# Patient Record
Sex: Female | Born: 1945 | Hispanic: No | Smoking: Never smoker
Health system: Southern US, Community
[De-identification: ages and names within clinical notes are randomized; demographics above are authoritative.]

## PROBLEM LIST (undated history)

## (undated) DIAGNOSIS — I517 Cardiomegaly: Secondary | ICD-10-CM

## (undated) DIAGNOSIS — I10 Essential (primary) hypertension: Secondary | ICD-10-CM

## (undated) DIAGNOSIS — Q254 Congenital malformation of aorta unspecified: Secondary | ICD-10-CM

## (undated) HISTORY — DX: Cardiomegaly: I51.7

## (undated) HISTORY — DX: Congenital malformation of aorta unspecified: Q25.40

## (undated) HISTORY — PX: HAND SURGERY: SHX662

## (undated) HISTORY — PX: WISDOM TOOTH EXTRACTION: SHX21

---

## 2004-01-14 ENCOUNTER — Other Ambulatory Visit: Admission: RE | Admit: 2004-01-14 | Discharge: 2004-01-14 | Payer: Self-pay | Admitting: Family Medicine

## 2004-02-11 ENCOUNTER — Encounter: Admission: RE | Admit: 2004-02-11 | Discharge: 2004-02-11 | Payer: Self-pay | Admitting: Family Medicine

## 2010-05-21 ENCOUNTER — Emergency Department (HOSPITAL_COMMUNITY)
Admission: EM | Admit: 2010-05-21 | Discharge: 2010-05-21 | Disposition: A | Discharge status: Home / Self Care | Payer: Self-pay | Attending: Emergency Medicine | Admitting: Emergency Medicine

## 2010-05-21 DIAGNOSIS — R059 Cough, unspecified: Secondary | ICD-10-CM | POA: Insufficient documentation

## 2010-05-21 DIAGNOSIS — I10 Essential (primary) hypertension: Secondary | ICD-10-CM | POA: Insufficient documentation

## 2010-05-21 DIAGNOSIS — R05 Cough: Secondary | ICD-10-CM | POA: Insufficient documentation

## 2010-05-21 LAB — POCT I-STAT, CHEM 8
BUN: 11 mg/dL (ref 6–23)
Calcium, Ion: 1.28 mmol/L (ref 1.12–1.32)
Chloride: 105 mEq/L (ref 96–112)
Creatinine, Ser: 0.9 mg/dL (ref 0.4–1.2)
Glucose, Bld: 84 mg/dL (ref 70–99)
HCT: 45 % (ref 36.0–46.0)
Hemoglobin: 15.3 g/dL — ABNORMAL HIGH (ref 12.0–15.0)
Potassium: 3.7 mEq/L (ref 3.5–5.1)
Sodium: 142 mEq/L (ref 135–145)
TCO2: 28 mmol/L (ref 0–100)

## 2010-06-20 ENCOUNTER — Emergency Department (HOSPITAL_COMMUNITY)
Admission: EM | Admit: 2010-06-20 | Discharge: 2010-06-20 | Disposition: A | Discharge status: Home / Self Care | Payer: Self-pay | Attending: Emergency Medicine | Admitting: Emergency Medicine

## 2010-06-20 DIAGNOSIS — I10 Essential (primary) hypertension: Secondary | ICD-10-CM | POA: Insufficient documentation

## 2010-06-20 LAB — POCT I-STAT, CHEM 8
BUN: 14 mg/dL (ref 6–23)
Calcium, Ion: 1.31 mmol/L (ref 1.12–1.32)
Chloride: 103 mEq/L (ref 96–112)
Creatinine, Ser: 0.9 mg/dL (ref 0.4–1.2)
Glucose, Bld: 78 mg/dL (ref 70–99)
HCT: 43 % (ref 36.0–46.0)
Hemoglobin: 14.6 g/dL (ref 12.0–15.0)
Potassium: 3.4 mEq/L — ABNORMAL LOW (ref 3.5–5.1)
Sodium: 141 mEq/L (ref 135–145)
TCO2: 30 mmol/L (ref 0–100)

## 2010-07-22 ENCOUNTER — Other Ambulatory Visit: Payer: Self-pay | Admitting: Family Medicine

## 2010-07-22 DIAGNOSIS — N644 Mastodynia: Secondary | ICD-10-CM

## 2010-07-27 ENCOUNTER — Ambulatory Visit
Admission: RE | Admit: 2010-07-27 | Discharge: 2010-07-27 | Disposition: A | Discharge status: Home / Self Care | Payer: Self-pay | Source: Ambulatory Visit | Attending: Family Medicine | Admitting: Family Medicine

## 2010-07-27 DIAGNOSIS — N644 Mastodynia: Secondary | ICD-10-CM

## 2013-10-29 ENCOUNTER — Emergency Department (HOSPITAL_COMMUNITY)
Admission: EM | Admit: 2013-10-29 | Discharge: 2013-10-29 | Disposition: A | Discharge status: Home / Self Care | Payer: Medicare Other | Attending: Emergency Medicine | Admitting: Emergency Medicine

## 2013-10-29 ENCOUNTER — Encounter (HOSPITAL_COMMUNITY): Payer: Self-pay | Admitting: Emergency Medicine

## 2013-10-29 DIAGNOSIS — W540XXA Bitten by dog, initial encounter: Secondary | ICD-10-CM | POA: Diagnosis not present

## 2013-10-29 DIAGNOSIS — I10 Essential (primary) hypertension: Secondary | ICD-10-CM | POA: Insufficient documentation

## 2013-10-29 DIAGNOSIS — Y93K1 Activity, walking an animal: Secondary | ICD-10-CM | POA: Diagnosis not present

## 2013-10-29 DIAGNOSIS — Y9289 Other specified places as the place of occurrence of the external cause: Secondary | ICD-10-CM | POA: Diagnosis not present

## 2013-10-29 DIAGNOSIS — Z23 Encounter for immunization: Secondary | ICD-10-CM | POA: Insufficient documentation

## 2013-10-29 DIAGNOSIS — S31109A Unspecified open wound of abdominal wall, unspecified quadrant without penetration into peritoneal cavity, initial encounter: Secondary | ICD-10-CM | POA: Diagnosis not present

## 2013-10-29 DIAGNOSIS — I159 Secondary hypertension, unspecified: Secondary | ICD-10-CM

## 2013-10-29 HISTORY — DX: Essential (primary) hypertension: I10

## 2013-10-29 MED ORDER — LISINOPRIL 10 MG PO TABS
10.0000 mg | ORAL_TABLET | Freq: Once | ORAL | Status: AC
  Administered 2013-10-29: 10 mg via ORAL
  Filled 2013-10-29: qty 1

## 2013-10-29 MED ORDER — LISINOPRIL 10 MG PO TABS: 10.0000 mg | ORAL_TABLET | Freq: Once | ORAL | Status: DC

## 2013-10-29 MED ORDER — TETANUS-DIPHTH-ACELL PERTUSSIS 5-2.5-18.5 LF-MCG/0.5 IM SUSP
0.5000 mL | Freq: Once | INTRAMUSCULAR | Status: AC
  Administered 2013-10-29: 0.5 mL via INTRAMUSCULAR
  Filled 2013-10-29: qty 0.5

## 2013-10-29 MED ORDER — AMOXICILLIN-POT CLAVULANATE 500-125 MG PO TABS: 1.0000 | ORAL_TABLET | Freq: Three times a day (TID) | ORAL | Status: DC

## 2013-10-29 NOTE — Discharge Instructions (Signed)
Animal Bite Animal bite wounds can get infected. It is important to get proper medical treatment. Ask your doctor if you need a rabies shot. HOME CARE   Follow your doctor's instructions for taking care of your wound.  Only take medicine as told by your doctor.  Take your medicine (antibiotics) as told. Finish them even if you start to feel better.  Keep all doctor visits as told. You may need a tetanus shot if:   You cannot remember when you had your last tetanus shot.  You have never had a tetanus shot.  The injury broke your skin. If you need a tetanus shot and you choose not to have one, you may get tetanus. Sickness from tetanus can be serious. GET HELP RIGHT AWAY IF:   Your wound is warm, red, sore, or puffy (swollen).  You notice yellowish-white fluid (pus) or a bad smell coming from the wound.  You see a red line on the skin coming from the wound.  You have a fever, chills, or you feel sick.  You feel sick to your stomach (nauseous), or you throw up (vomit).  Your pain does not go away, or it gets worse.  You have trouble moving the injured part.  You have questions or concerns. MAKE SURE YOU:   Understand these instructions.  Will watch your condition.  Will get help right away if you are not doing well or get worse. Document Released: 03/27/2005 Document Revised: 06/19/2011 Document Reviewed: 11/16/2010 [facility] Patient Information 2015 [place], [place]. This information is not intended to replace advice given to you by your health care provider. Make sure you discuss any questions you have with your health care provider.    Emergency Department Resource Guide 1) Find a Doctor and Pay Out of Pocket Although you won't have to find out who is covered by your insurance plan, it is a good idea to ask around and get recommendations. You will then need to call the office and see if the doctor you have chosen will accept you as a new patient and what types of  options they offer for patients who are self-pay. Some doctors offer discounts or will set up payment plans for their patients who do not have insurance, but you will need to ask so you aren't surprised when you get to your appointment.  2) Contact Your Local Health Department Not all health departments have doctors that can see patients for sick visits, but many do, so it is worth a call to see if yours does. If you don't know where your local health department is, you can check in your phone book. The CDC also has a tool to help you locate your state's health department, and many state websites also have listings of all of their local health departments.  3) Find a [facility] If your illness is not likely to be very severe or complicated, you may want to try a walk in clinic. These are popping up all over the country in pharmacies, drugstores, and shopping centers. They're usually staffed by nurse practitioners or physician assistants that have been trained to treat common illnesses and complaints. They're usually fairly quick and inexpensive. However, if you have serious medical issues or chronic medical problems, these are probably not your best option.  No Primary Care Doctor: - Call Health Connect at  [phone] - they can help you locate a primary care doctor that  accepts your insurance, provides certain services, etc. - Physician Referral Service- [phone]  Chronic Pain Problems: Organization         Address  Phone   Notes  [facility]  [phone] Patients need to be referred by their primary care doctor.   Medication Assistance: Organization         Address  Phone   Notes  [facility] Medication [facility] [place] [place], [place] [address] [phone] --Must be a resident of [facility] -- Must have NO insurance coverage whatsoever (no Medicaid/ Medicare, etc.) -- The pt. MUST have a primary care doctor that directs  their care regularly and follows them in the community   Med[facility]ist  [phone]   [employer]  [phone]    Agencies that provide inexpensive medical care: Organization         Address  Phone   Notes  [place]  [phone]   [facility]    [phone]   [facility], [place] [address] [phone]   [place] [address], [place] [phone]   Planned Parenthood    [phone]   [facility]    [phone]   [place] and [place] [address], [place] Phone:  [phone], Fax:  [phone] Hours of Operation:  9 am - 6 pm, M-F.  Also accepts Medicaid/Medicare and self-pay.  [facility] for [address], [address], [place] Phone: [phone], Fax: [phone]. Hours of Operation:  8:30 am - 5:30 pm, M-F.  Also accepts Medicaid and self-pay.  [facility] [place] [address], [place] Phone: [phone]   [place], [place], [place] [phone], Ext. [phone] Mondays & Thursdays: 7-9 AM.  First 15 patients are seen on a first come, first serve basis.    [place] Providers:  Organization         Address  Phone   Notes  [facility] [address], [place] [phone] Also accepts self-pay patients.  [facility] [address], [place]  [phone]   [place], [address], [place] [phone]   [facility] [address], [place] [phone]   Lucianne Lei [address], [place]   [phone] Only accepts [place] Access [place] patients after they have their name applied to their card.   Self-Pay (no insurance) in [facility]:  Organization          Address  Phone   Notes  Sickle Cell Patients, [facility] [place] [phone]   [facility] [place] [phone]   [facility] [place]  [place], [place], [place] [phone]   [facility]/Dr. Osei-Bonsu  [address] [place] or [address], [place] [phone] Phone number for both [place] and [place] locations is the same.  [facility] [address], [place] [phone]   [facility] [address], [place] or [address] [phone]   [facility] [address], [place] [phone], phone; [phone], fax Sees  patients 1st and 3rd Saturday of every month.  Must not qualify for public or private insurance (i.e. Medicaid, Medicare, [place] Health Choice, Veterans' Benefits)  Household income should be no more than 200% of the poverty level The clinic cannot treat you if you are pregnant or think you are pregnant  Sexually transmitted diseases are not treated at the clinic.    Dental Care: Organization         Address  Phone  Notes  [facility] [place] [phone] Accepts children up to age 63 who are enrolled in [place] or [place]; pregnant women with a Medicaid card; and children who have applied for Medicaid or [place] Health Choice, but were declined, whose parents can pay a reduced fee at time of service.  [facility] Department of [facility]  [address], [place] [phone] Accepts children up to age 75 who are enrolled in [place] or [place]; pregnant women with a Medicaid card; and children who have applied for Medicaid or  Health Choice, but were declined, whose parents can pay a reduced fee at time of  service.  [facility] Adult [facility] Access PROGRAM  [place] [phone] Patients are seen by appointment only. Walk-ins are not accepted. [place] will see patients 67 years of age and older. Monday - Tuesday (8am-5pm) Most Wednesdays (8:30-5pm) $30 per visit, cash only  [facility] Adult [facility] Access PRO[facility]  [address], [facility] [phone] Patients are seen by appointment only. Walk-ins are not accepted. [place] will see patients 65 years of age and older. One Wednesday Evening (Monthly: Volunteer Based).  $30 per visit, cash only  [place]  [phone] for adults; Children under age 49, call Graduate Pediatric Dentistry at [phone]. Children aged 1-14, please call [phone] to request a pediatric application.  Dental services are provided in all areas of dental care including fillings, crowns and bridges, complete and partial dentures, implants, gum treatment, root canals, and extractions. Preventive care is also provided. Treatment is provided to both adults and children. Patients are selected via a lottery and there is often a waiting list.   [facility] [address] [place]  [phone] [email or web address]   [facility] [address] [place], [place] [phone], Ext. [phone] Second and Fourth Thursday of each month, opens at 6:30 AM; Clinic ends at 9 AM.  Patients are seen on a first-come first-served basis, and a limited number are seen during each clinic.   [facility]  [address] Hillard Danker [place], [place] [phone]   Eligibility Requirements You must have lived in [place], [place], or [place] counties for at least the last three months.   You cannot be eligible for state or federal sponsored [employer], including [employer], [place], or [employer].   You generally cannot be eligible for healthcare insurance through your employer.     How to apply: Eligibility screenings are held every Tuesday and Wednesday afternoon from 1:00 pm until 4:00 pm. You do not need an appointment for the interview!  [facility] [address] [place], [place]   [place]  [place] Department  [place]  [phone]    Behavioral Health Resources in the Community: Intensive Outpatient Programs  Organization         Address  Phone  Notes  [employer] [facility] [address] [place], [place] [phone]   [facility] [address], [place], [place]   ADS: [facility] Drug [facility] [address], [place], [place]   [place] [address],  [place], [place] or [phone]   Substance Abuse Resources Organization         Address  Phone  Notes  Alcohol and Drug Services  [phone]   [place]  [phone]   [place]   Chinita Pester  [phone]   Residential & Outpatient Substance Abuse Program  [phone]   Psychological Services Organization         Address  Phone  Notes  [facility]  [place]  [phone]   [place] [address] [place] or [phone]    Mobile Crisis Teams Organization         Address  Phone  Notes  [facility], Mobile Crisis Care Unit  [phone]   [facility]  [address] [place], [place]   [place] [address] [place] [phone]    Self-Help/Support Groups Organization         Address  Phone             Notes  [place]. of [place] - variety of support groups  [place] Call for more information  Narcotics Anonymous (NA), [facility] [address]  Dr, [employer] [place]  2 meetings at this location   Special educational needs teacher         Address  Phone  Notes  [facility] Residential Treatment [place],    [place]  [phone]   [facility]  [address] [place] [phone], [place], [place]   [employer] [place], [place] [phone] Admissions: 8am-3pm M-F  Incentives Substance [place] [address]    [place], [place] [phone]   [facility] [address] [place], [place], [place]   [facility] [address]  [place], [place]   [facility] Programs - Intensive Outpatient [address], [place], [place]   [facility] [address]  [place], [place] [phone] or [phone]   [facility] Services (RTS) [address] [place], [place] Accepts Medicaid  [facility] [address],  [place] [phone] Substance Abuse/Addiction Treatment   [facility] Organization         Address  Phone  Notes  [facility]  [phone]   Domenic Schwab, PhD [address] [place], [place]   [phone] or [phone]   [place], [place] [phone]   [place] [address] [place], [place] [phone] Insurance/Medicaid/sponsorship through [facility] and Families [address] [place]                                    [place], [place] [phone] [place] [address] [place] [phone]    Dr. Adele Schilder  (  [phone]) [phone]   [facility] Dept. 1) [address] [place] 2) [place] 3)  [place] [address], [place] [phone]  [phone]   [place] [phone] or [phone] (After Hours)

## 2013-10-29 NOTE — ED Provider Notes (Signed)
CSN: [id number]     Arrival date & time 10/29/13  1209 History   First MD Initiated Contact with Patient 10/29/13 1235     Chief Complaint  Patient presents with  . Animal Bite     (Consider location/radiation/quality/duration/timing/severity/associated sxs/prior Treatment) The history is provided by the patient and a relative. A language interpreter was used.   Pt is a 68yo female with hx of HTN and medication non-compliance, presenting to ED with c/o right flank pain after she was attacked by her neighbor's pit-bull yesterday around 6PM while walking her dog. Pt is accompanied by her daughter. Neighbor advised pt the dog has its rabies vaccinations.  Pt states pain is constant, sore, 4/10. No pain medication taken PTA.    Denies any other injuries. Pt states she is here to make sure it is not infected as bruising has worsened. Denies fever, n/v/d.  Last Tdap: unknown.  Pt states she has not taken her BP medication for about 3 years.  Daughter states pt won't take her medication because she is stubborn.    Past Medical History  Diagnosis Date  . Hypertension    Past Surgical History  Procedure Laterality Date  . Hand surgery     History reviewed. No pertinent family history. History  Substance Use Topics  . Smoking status: Never Smoker   . Smokeless tobacco: Not on file  . Alcohol Use: No   OB History   Grav Para Term Preterm Abortions TAB SAB Ect Mult Living                 Review of Systems  Constitutional: Negative for fever and chills.  Respiratory: Negative for cough and shortness of breath.   Musculoskeletal: Positive for myalgias. Negative for back pain.  Skin: Positive for color change and wound.  All other systems reviewed and are negative.     Allergies  Review of patient's allergies indicates no known allergies.  Home Medications   Prior to Admission medications   Medication Sig Start Date End Date Taking? Authorizing Provider  amoxicillin-clavulanate  (AUGMENTIN) 500-125 MG per tablet Take 1 tablet (500 mg total) by mouth every 8 (eight) hours. 10/29/13   Noland Fordyce, PA-C  lisinopril (PRINIVIL,ZESTRIL) 10 MG tablet Take 1 tablet (10 mg total) by mouth once. 10/29/13   Noland Fordyce, PA-C   BP 156/97  Pulse 57  Temp(Src) 98.2 F (36.8 C) (Oral)  Resp 18  SpO2 98% Physical Exam  Nursing note and vitals reviewed. Constitutional: She appears well-developed and well-nourished. No distress.  Pt lying on exam bed, NAD.   HENT:  Head: Normocephalic and atraumatic.  Eyes: Conjunctivae are normal. No scleral icterus.  Neck: Normal range of motion.  Cardiovascular: Normal rate, regular rhythm and normal heart sounds.   Pulmonary/Chest: Effort normal and breath sounds normal. No respiratory distress. She has no wheezes. She has no rales. She exhibits no tenderness.  Abdominal: Soft. Bowel sounds are normal. She exhibits no distension and no mass. There is tenderness ( mild tenderness to right flank over wounds and ecchymosis). There is no rebound and no guarding.    Two areas on right flank of ecchymosis and superficial lacerations with dried blood. No active bleeding.  No foreign bodies. No induration or fluctuance. Mild tenderness to palpation.  Abdomen otherwise soft, non-tender. Non-distended.   Musculoskeletal: Normal range of motion.  Neurological: She is alert.  Skin: Skin is warm and dry. She is not diaphoretic.    ED Course  Procedures  The wound is cleansed, debrided of foreign material as much as possible, and dressed. The patient is alerted to watch for any signs of infection (redness, pus, pain, increased swelling or fever) and call if such occurs. Home wound care instructions are provided. Tetanus vaccination status reviewed: unknown, pt given Tdap in ED.  Labs Review Labs Reviewed - No data to display  Imaging Review No results found.   EKG Interpretation None      MDM   Final diagnoses:  Dog bite  Secondary  hypertension, unspecified    Pt is a 68yo female presenting to ED with superficial lacerations and ecchymosis to right flank after attacked by neighbor's dog yesterday.  No active bleeding.  No gaping wounds. No bony tenderness.  Wound does not appear infected. Will irrigate wounds and bandage with gauze. Tdap given in ED.  Will start pt on Augmentin.  Pt encouraged to take BP medication, will write prescription today for lisinopril. Discussed risks of uncontrolled BP. Advised to f/u with PCP for recheck and ongoing management of BP. Return precautions provided. Pt verbalized understanding and agreement with tx plan.     Noland Fordyce, PA-C 10/29/13 1504

## 2013-10-29 NOTE — ED Notes (Addendum)
Pt was walking her dog yesterday and a neighbors dog attacked her. She has several puncture wounds with dried blood to R side/abd area with bruising. She said the dogs owner told her that is does have its rabies vaccinations. Her bp is elevated she states she has a hx htn but does not take any medication for it

## 2013-10-31 NOTE — ED Provider Notes (Signed)
Medical screening examination/treatment/procedure(s) were performed by non-physician practitioner and as supervising physician I was immediately available for consultation/collaboration.   EKG Interpretation None       Jasper Riling. Alvino Chapel, MD 10/31/13 [phone]

## 2014-11-09 ENCOUNTER — Ambulatory Visit (INDEPENDENT_AMBULATORY_CARE_PROVIDER_SITE_OTHER): Payer: Medicare Other | Admitting: Emergency Medicine

## 2014-11-09 VITALS — BP 172/104 | HR 68 | Temp 98.6°F | Resp 17 | Ht 59.5 in | Wt 132.0 lb

## 2014-11-09 DIAGNOSIS — I10 Essential (primary) hypertension: Secondary | ICD-10-CM | POA: Diagnosis not present

## 2014-11-09 LAB — CBC
HCT: 40.9 % (ref 36.0–46.0)
Hemoglobin: 12.7 g/dL (ref 12.0–15.0)
MCH: 22.2 pg — ABNORMAL LOW (ref 26.0–34.0)
MCHC: 31.1 g/dL (ref 30.0–36.0)
MCV: 71.4 fL — ABNORMAL LOW (ref 78.0–100.0)
MPV: 10 fL (ref 8.6–12.4)
Platelets: 266 10*3/uL (ref 150–400)
RBC: 5.73 MIL/uL — ABNORMAL HIGH (ref 3.87–5.11)
RDW: 15.9 % — ABNORMAL HIGH (ref 11.5–15.5)
WBC: 5.7 10*3/uL (ref 4.0–10.5)

## 2014-11-09 LAB — COMPREHENSIVE METABOLIC PANEL
ALT: 10 U/L (ref 6–29)
AST: 22 U/L (ref 10–35)
Albumin: 3.9 g/dL (ref 3.6–5.1)
Alkaline Phosphatase: 61 U/L (ref 33–130)
BUN: 18 mg/dL (ref 7–25)
CO2: 24 mmol/L (ref 20–31)
Calcium: 9.4 mg/dL (ref 8.6–10.4)
Chloride: 105 mmol/L (ref 98–110)
Creat: 0.88 mg/dL (ref 0.50–0.99)
Glucose, Bld: 88 mg/dL (ref 65–99)
Potassium: 4 mmol/L (ref 3.5–5.3)
Sodium: 140 mmol/L (ref 135–146)
Total Bilirubin: 0.4 mg/dL (ref 0.2–1.2)
Total Protein: 6.6 g/dL (ref 6.1–8.1)

## 2014-11-09 LAB — LIPID PANEL
Cholesterol: 165 mg/dL (ref 125–200)
HDL: 40 mg/dL — ABNORMAL LOW (ref >=46)
LDL Cholesterol: 99 mg/dL (ref <130)
Total CHOL/HDL Ratio: 4.1 Ratio (ref <=5.0)
Triglycerides: 129 mg/dL (ref <150)
VLDL: 26 mg/dL (ref <30)

## 2014-11-09 MED ORDER — LISINOPRIL-HYDROCHLOROTHIAZIDE 20-12.5 MG PO TABS: 1.0000 | ORAL_TABLET | Freq: Every day | ORAL | Status: DC

## 2014-11-09 NOTE — Patient Instructions (Signed)

## 2014-11-09 NOTE — Progress Notes (Signed)
Subjective:  Patient ID: Mackenzie Taylor, female    DOB: 1946/01/15  Age: 69 y.o. MRN: [id number]  CC: Hypertension   HPI Mackenzie Taylor presents  with a history of hypertension. She has not been taking any blood pressure medicine at least for the last year. She has no symptoms but decided she better get her pressure check. She has no nausea vomiting chest pain headache shortness of breath cough peripheral edema or other complaints. She is not fasting and hasn't had lab done in a long time  History [place] has a past medical history of Hypertension.   She has past surgical history that includes Hand surgery.   Her  family history is not on file.  She   reports that she has never smoked. She does not have any smokeless tobacco history on file. She reports that she does not drink alcohol or use illicit drugs.  Outpatient Prescriptions Prior to Visit  Medication Sig Dispense Refill  . lisinopril (PRINIVIL,ZESTRIL) 10 MG tablet Take 1 tablet (10 mg total) by mouth once. 30 tablet 1  . amoxicillin-clavulanate (AUGMENTIN) 500-125 MG per tablet Take 1 tablet (500 mg total) by mouth every 8 (eight) hours. 21 tablet 0   No facility-administered medications prior to visit.    History   Social History  . Marital Status: Divorced    Spouse Name: N/A  . Number of Children: N/A  . Years of Education: N/A   Social History Main Topics  . Smoking status: Never Smoker   . Smokeless tobacco: Not on file  . Alcohol Use: No  . Drug Use: No  . Sexual Activity: Not on file   Other Topics Concern  . None   Social History Narrative     Review of Systems  Constitutional: Negative for fever, chills and appetite change.  HENT: Negative for congestion, ear pain, postnasal drip, sinus pressure and sore throat.   Eyes: Negative for pain and redness.  Respiratory: Negative for cough, shortness of breath and wheezing.   Cardiovascular: Negative for leg swelling.  Gastrointestinal: Negative for nausea,  vomiting, abdominal pain, diarrhea, constipation and blood in stool.  Endocrine: Negative for polyuria.  Genitourinary: Negative for dysuria, urgency, frequency and flank pain.  Musculoskeletal: Negative for gait problem.  Skin: Negative for rash.  Neurological: Negative for weakness and headaches.  Psychiatric/Behavioral: Negative for confusion and decreased concentration. The patient is not nervous/anxious.     Objective:  BP 172/104 mmHg  Pulse 68  Temp(Src) 98.6 F (37 C) (Oral)  Resp 17  Ht 4' 11.5" (1.511 m)  Wt 132 lb (59.875 kg)  BMI 26.23 kg/m2  SpO2 98%  Physical Exam  Constitutional: She is oriented to person, place, and time. She appears well-developed and well-nourished. No distress.  HENT:  Head: Normocephalic and atraumatic.  Right Ear: External ear normal.  Left Ear: External ear normal.  Nose: Nose normal.  Eyes: Conjunctivae and EOM are normal. Pupils are equal, round, and reactive to light. No scleral icterus.  Neck: Normal range of motion. Neck supple. No tracheal deviation present.  Cardiovascular: Normal rate, regular rhythm and normal heart sounds.   Pulmonary/Chest: Effort normal. No respiratory distress. She has no wheezes. She has no rales.  Abdominal: She exhibits no mass. There is no tenderness. There is no rebound and no guarding.  Musculoskeletal: She exhibits no edema.  Lymphadenopathy:    She has no cervical adenopathy.  Neurological: She is alert and oriented to person, place, and time. Coordination normal.  Skin: Skin is warm and dry. No rash noted.  Psychiatric: She has a normal mood and affect. Her behavior is normal.      Assessment & Plan:   Yomaira was seen today for hypertension.  Diagnoses and all orders for this visit:  Essential hypertension, benign Orders: -     Comprehensive metabolic panel -     Lipid panel -     CBC  Other orders -     lisinopril-hydrochlorothiazide (ZESTORETIC) 20-12.5 MG per tablet; Take 1 tablet by  mouth daily.   I have discontinued Ms. Hallett's amoxicillin-clavulanate, lisinopril, and amoxicillin. I am also having her start on lisinopril-hydrochlorothiazide.  Meds ordered this encounter  Medications  . DISCONTD: amoxicillin (AMOXIL) 500 MG capsule    Sig: Take 500 mg by mouth 3 (three) times daily.  [place] Kitchen lisinopril-hydrochlorothiazide (ZESTORETIC) 20-12.5 MG per tablet    Sig: Take 1 tablet by mouth daily.    Dispense:  90 tablet    Refill:  3    Appropriate red flag conditions were discussed with the patient as well as actions that should be taken.  Patient expressed his understanding.  Follow-up: Return in about 1 month (around 12/10/2014).  Roselee Culver, MD

## 2014-11-10 ENCOUNTER — Encounter: Payer: Self-pay | Admitting: Family Medicine

## 2014-12-07 ENCOUNTER — Ambulatory Visit: Payer: Medicare Other | Admitting: Family Medicine

## 2015-12-22 ENCOUNTER — Other Ambulatory Visit: Payer: Self-pay

## 2015-12-22 MED ORDER — LISINOPRIL-HYDROCHLOROTHIAZIDE 20-12.5 MG PO TABS: 1.0000 | ORAL_TABLET | Freq: Every day | ORAL | 0 refills | Status: DC

## 2016-04-27 ENCOUNTER — Ambulatory Visit (HOSPITAL_COMMUNITY)
Admission: EM | Admit: 2016-04-27 | Discharge: 2016-04-27 | Disposition: A | Discharge status: Home / Self Care | Payer: Medicare Other | Attending: Emergency Medicine | Admitting: Emergency Medicine

## 2016-04-27 ENCOUNTER — Encounter (HOSPITAL_COMMUNITY): Payer: Self-pay | Admitting: Emergency Medicine

## 2016-04-27 DIAGNOSIS — I160 Hypertensive urgency: Secondary | ICD-10-CM

## 2016-04-27 DIAGNOSIS — R05 Cough: Secondary | ICD-10-CM

## 2016-04-27 DIAGNOSIS — I10 Essential (primary) hypertension: Secondary | ICD-10-CM | POA: Diagnosis not present

## 2016-04-27 DIAGNOSIS — R059 Cough, unspecified: Secondary | ICD-10-CM

## 2016-04-27 MED ORDER — VALSARTAN-HYDROCHLOROTHIAZIDE 160-25 MG PO TABS: 1.0000 | ORAL_TABLET | Freq: Every day | ORAL | 0 refills | Status: DC

## 2016-04-27 NOTE — ED Provider Notes (Signed)
CSN: [id number]     Arrival date & time 04/27/16  1319 History   First MD Initiated Contact with Patient 04/27/16 1432     Chief Complaint  Patient presents with  . Hypertension   (Consider location/radiation/quality/duration/timing/severity/associated sxs/prior Treatment) 71 year old [place] female accompanied by her interpreter daughter is here for refill of her antihypertensive medication. She has had a history of hypertension for well over 10 years and for the past couple years she has been treated at [facility]. Apparently they will not treat her pressure now. They are [facility]. She has been out of her BP medicine for about a month. Her blood pressure today is 235/123. She has no complaints.  Second concern is that of a cough for almost 10 years. She states that she has been on the same medication (ACE inhibitor) for approximately that period of time and no one has determine the etiology of the cough.      Past Medical History:  Diagnosis Date  . Hypertension    Past Surgical History:  Procedure Laterality Date  . HAND SURGERY     History reviewed. No pertinent family history. Social History  Substance Use Topics  . Smoking status: Never Smoker  . Smokeless tobacco: Never Used  . Alcohol use No   OB History    No data available     Review of Systems  Constitutional: Negative.   HENT: Negative.   Respiratory: Positive for cough. Negative for chest tightness and shortness of breath.   Cardiovascular: Negative for chest pain, palpitations and leg swelling.  Gastrointestinal: Negative.   Musculoskeletal: Negative.   Skin: Negative.   Neurological: Negative for dizziness, syncope, speech difficulty, light-headedness and headaches.  Hematological: Does not bruise/bleed easily.  All other systems reviewed and are negative.   Allergies  Patient has no known allergies.  Home Medications   Prior to Admission medications    Medication Sig Start Date End Date Taking? Authorizing Provider  valsartan-hydrochlorothiazide (DIOVAN-HCT) 160-25 MG tablet Take 1 tablet by mouth daily. 04/27/16   Janne Napoleon, NP   Meds Ordered and Administered this Visit  Medications - No data to display  BP (!) 235/123 (BP Location: Left Arm)   Pulse 76   Temp 98.4 F (36.9 C) (Oral)   Resp 20   SpO2 100%  No data found.   Physical Exam  Constitutional: She appears well-developed and well-nourished. No distress.  HENT:  Head: Normocephalic and atraumatic.  Eyes: EOM are normal.  Neck: Normal range of motion. Neck supple.  Cardiovascular: Normal rate, regular rhythm, normal heart sounds and intact distal pulses.   No murmur heard. Pulmonary/Chest: Effort normal and breath sounds normal. No respiratory distress. She has no wheezes. She has no rales.  Musculoskeletal: Normal range of motion. She exhibits no edema.  Lymphadenopathy:    She has no cervical adenopathy.  Neurological: She is alert. No cranial nerve deficit. Coordination normal.  Skin: Skin is warm and dry.  Psychiatric: She has a normal mood and affect.  Nursing note and vitals reviewed.   Urgent Care Course     Procedures (including critical care time)  Labs Review Labs Reviewed - No data to display  Imaging Review No results found.   Visual Acuity Review  Right Eye Distance:   Left Eye Distance:   Bilateral Distance:    Right Eye Near:   Left Eye Near:    Bilateral Near:  MDM   1. Hypertensive urgency   2. Essential hypertension   3. Cough    Medication for your blood pressure has been changed since the lisinopril may be producing the cough that she has had for 10 years. The medicine that she is prescribed a similar but has much less risk for cough. Take as directed. She needs to follow-up with primary care provider sent is possible. [facility]. She will need to have additional lab work and  other studies. There is a risk of taking medications prescribed by a health care provider without obtaining a complete history, labwork or proper physical exam, etc. This cannot be completed adequately at an urgent care. There can be multiple problems, some serious,  associated with medications and undetermined conditions of your health status. This action is performed as a last resort in order to supply you with medication. By receiving these prescriptions you are ackowleging and accepting these risks and will not hold the prescriber or any agent of [facility] as responsible for any adverse outcomes.  Meds ordered this encounter  Medications  . valsartan-hydrochlorothiazide (DIOVAN-HCT) 160-25 MG tablet    Sig: Take 1 tablet by mouth daily.    Dispense:  30 tablet    Refill:  0    Order Specific Question:   Supervising Provider    Answer:   Carmela Hurt         Janne Napoleon, NP 04/27/16 1455

## 2016-04-27 NOTE — Discharge Instructions (Signed)
Medication for your blood pressure has been changed since the lisinopril may be producing the cough that she has had for 10 years. The medicine that she is prescribed a similar but has much less risk for cough. Take as directed. She needs to follow-up with primary care provider sent is possible. [facility]. She will need to have additional lab work and other studies. There is a risk of taking medications prescribed by a health care provider without obtaining a complete history, labwork or proper physical exam, etc. This cannot be completed adequately at an urgent care. There can be multiple problems, some serious,  associated with medications and undetermined conditions of your health status. This action is performed as a last resort in order to supply you with medication. By receiving these prescriptions you are ackowleging and accepting these risks and will not hold the prescriber or any agent of [facility] as responsible for any adverse outcomes.

## 2016-04-27 NOTE — ED Triage Notes (Signed)
Via 71 y/o daughter, [place] interpreter...   Pt c/o HTN... Has not had BP meds x1 month  Denies: HAs, CP, n/v, diaphoresis, blurred vision  Does not have PCP  A&O x4... NAD

## 2016-04-27 NOTE — ED Notes (Signed)
Notified Janne Napoleon, NP of BP

## 2016-04-30 ENCOUNTER — Ambulatory Visit (HOSPITAL_COMMUNITY)
Admission: EM | Admit: 2016-04-30 | Discharge: 2016-04-30 | Disposition: A | Discharge status: Home / Self Care | Payer: Medicare Other | Attending: Emergency Medicine | Admitting: Emergency Medicine

## 2016-04-30 ENCOUNTER — Ambulatory Visit (INDEPENDENT_AMBULATORY_CARE_PROVIDER_SITE_OTHER): Payer: Medicare Other

## 2016-04-30 ENCOUNTER — Encounter (HOSPITAL_COMMUNITY): Payer: Self-pay | Admitting: *Deleted

## 2016-04-30 DIAGNOSIS — Z8679 Personal history of other diseases of the circulatory system: Secondary | ICD-10-CM | POA: Diagnosis not present

## 2016-04-30 DIAGNOSIS — M7989 Other specified soft tissue disorders: Secondary | ICD-10-CM | POA: Diagnosis not present

## 2016-04-30 DIAGNOSIS — S5011XA Contusion of right forearm, initial encounter: Secondary | ICD-10-CM

## 2016-04-30 DIAGNOSIS — S59911A Unspecified injury of right forearm, initial encounter: Secondary | ICD-10-CM | POA: Diagnosis not present

## 2016-04-30 DIAGNOSIS — W010XXA Fall on same level from slipping, tripping and stumbling without subsequent striking against object, initial encounter: Secondary | ICD-10-CM

## 2016-04-30 DIAGNOSIS — S50811A Abrasion of right forearm, initial encounter: Secondary | ICD-10-CM

## 2016-04-30 NOTE — ED Provider Notes (Signed)
CSN: [id number]     Arrival date & time 04/30/16  1705 History   First MD Initiated Contact with Patient 04/30/16 1856     Chief Complaint  Patient presents with  . Arm Injury   (Consider location/radiation/quality/duration/timing/severity/associated sxs/prior Treatment)  Pt is [place] speaking, adult daughter translated for pt.   HPI\ Mackenzie Taylor is a 71 y.o. female presenting to UC with c/o Right forearm pain, bruising and swelling that started about 2 hours ago after pt tripped and fell in her back yard. Pt concerned she may have something such as wood or a nail stuck in her skin due to the swelling but she notes it was so dark she is unsure what she fell on.  Pain is aching and sore, 4/10.  Denies hitting her head or other injuries during the fall.  BP elevated in triage. Pt was seen in UC 4 days ago and started on a new BP medication. Daughter states they are currently looking for a PCP.   Past Medical History:  Diagnosis Date  . Hypertension    Past Surgical History:  Procedure Laterality Date  . HAND SURGERY    . WISDOM TOOTH EXTRACTION     No family history on file. Social History  Substance Use Topics  . Smoking status: Never Smoker  . Smokeless tobacco: Never Used  . Alcohol use No   OB History    No data available     Review of Systems  Musculoskeletal: Positive for joint swelling and myalgias. Negative for arthralgias.       Right forearm   Skin: Positive for color change and wound. Negative for rash.    Allergies  Patient has no known allergies.  Home Medications   Prior to Admission medications   Medication Sig Start Date End Date Taking? Authorizing Provider  ASPIRIN 81 PO Take by mouth.   Yes Historical Provider, MD  valsartan-hydrochlorothiazide (DIOVAN-HCT) 160-25 MG tablet Take 1 tablet by mouth daily. 04/27/16  Yes Janne Napoleon, NP   Meds Ordered and Administered this Visit  Medications - No data to display  BP (!) 174/116   Pulse 73   Temp  97.5 F (36.4 C) (Oral)   Resp 16   SpO2 100%  No data found.   Physical Exam  Constitutional: She is oriented to person, place, and time. She appears well-developed and well-nourished.  HENT:  Head: Normocephalic and atraumatic.  Eyes: EOM are normal.  Neck: Normal range of motion.  Cardiovascular: Normal rate.   Pulmonary/Chest: Effort normal.  Musculoskeletal: Normal range of motion. She exhibits edema and tenderness.  Right forearm: mild edema to dorsal mid forearm. Tender. Full ROM elbow and wrist, non-tender. (see skin exam)  Neurological: She is alert and oriented to person, place, and time.  Skin: Skin is warm and dry.  Right forearm, dorsal mid aspect: 2cm area of edema and ecchymosis with hematoma. Centralized superficial abrasion. No active bleeding. Mildly tender. No foreign body seen or palpated.  Psychiatric: She has a normal mood and affect. Her behavior is normal.  Nursing note and vitals reviewed.   Urgent Care Course     Procedures (including critical care time)  Labs Review Labs Reviewed - No data to display  Imaging Review Dg Forearm Right  Result Date: 04/30/2016 CLINICAL DATA:  Pain after fall.  Posterolateral for bruising. EXAM: RIGHT FOREARM - 2 VIEW COMPARISON:  None. FINDINGS: There soft tissue swelling along the dorsum of the forearm. Proximal ulnar spurring is seen posteriorly  and along its ulnar aspect off the coronoid. Small accessory ossicle is noted off the tip of the ulnar styloid. No acute fracture nor bone destruction. No malalignment at the elbow joint nor joint effusion. The osseous elements of the carpal bones are nonacute in appearance. IMPRESSION: Soft tissue swelling without acute osseous abnormality of the right forearm. Proximal ulnar spurring. Electronically Signed   By: Ashley Royalty M.D.   On: 04/30/2016 19:53     MDM   1. Traumatic hematoma of right forearm, initial encounter   2. Abrasion of right forearm, initial encounter   3.  History of hypertension   4. Fall from slip, trip, or stumble, initial encounter    Hematoma and abrasion to Right forearm.   No foreign body noted on exam or on imaging. Home care instructions for hematoma and wound care provided. Also encouraged to keep taking BP medications as prescribed and f/u with PCP for ongoing maintenance of HTN.     Noland Fordyce, PA-C 04/30/16 2012

## 2016-04-30 NOTE — Discharge Instructions (Signed)
° °  Keep wound clean with warm water and soap.  You may use over the counter antibiotic ointment such as polysporin or neosporin then apply a bandage 2-3 times daily for 1 week.   You may apply a warm damp washcloth to swollen ara to help with soreness.  Alternate with a cool compress 2-3 times daily.

## 2016-04-30 NOTE — ED Triage Notes (Signed)
Per family member, pt stumbled and fell in back yard, hitting right forearm approx 2 hrs ago.  Unsure what object arm hit.  Tiny scabbed laceration noted with larger area of ecchymosis and swelling noted.  Upon palpation of forearm bones, pt denies pain, states discomfort only at wound/ecchymotic area.

## 2016-06-02 ENCOUNTER — Encounter: Payer: Self-pay | Admitting: Student

## 2016-06-02 ENCOUNTER — Ambulatory Visit (INDEPENDENT_AMBULATORY_CARE_PROVIDER_SITE_OTHER): Payer: Medicare Other | Admitting: Student

## 2016-06-02 VITALS — BP 186/130 | HR 68 | Temp 97.5°F | Ht 60.25 in | Wt 139.0 lb

## 2016-06-02 DIAGNOSIS — I10 Essential (primary) hypertension: Secondary | ICD-10-CM | POA: Diagnosis not present

## 2016-06-02 DIAGNOSIS — K219 Gastro-esophageal reflux disease without esophagitis: Secondary | ICD-10-CM

## 2016-06-02 LAB — LIPID PANEL
Cholesterol: 209 mg/dL — ABNORMAL HIGH (ref <200)
HDL: 50 mg/dL — ABNORMAL LOW (ref >50)
LDL Cholesterol: 135 mg/dL — ABNORMAL HIGH (ref <100)
Total CHOL/HDL Ratio: 4.2 Ratio (ref <5.0)
Triglycerides: 121 mg/dL (ref <150)
VLDL: 24 mg/dL (ref <30)

## 2016-06-02 LAB — BASIC METABOLIC PANEL WITH GFR
BUN: 17 mg/dL (ref 7–25)
CO2: 25 mmol/L (ref 20–31)
Calcium: 9.9 mg/dL (ref 8.6–10.4)
Chloride: 105 mmol/L (ref 98–110)
Creat: 0.81 mg/dL (ref 0.50–0.99)
GFR, Est African American: 86 mL/min (ref >=60)
GFR, Est Non African American: 74 mL/min (ref >=60)
Glucose, Bld: 79 mg/dL (ref 65–99)
Potassium: 3.9 mmol/L (ref 3.5–5.3)
Sodium: 140 mmol/L (ref 135–146)

## 2016-06-02 LAB — TSH: TSH: 2.38 mIU/L

## 2016-06-02 MED ORDER — PANTOPRAZOLE SODIUM 40 MG PO TBEC: 40.0000 mg | DELAYED_RELEASE_TABLET | Freq: Every day | ORAL | 3 refills | Status: DC

## 2016-06-02 MED ORDER — VALSARTAN-HYDROCHLOROTHIAZIDE 160-25 MG PO TABS: 1.0000 | ORAL_TABLET | Freq: Every day | ORAL | 0 refills | Status: DC

## 2016-06-02 NOTE — Patient Instructions (Addendum)
It was great seeing you today! We have addressed the following issues today 1. High blood pressure: Your blood pressure is 186/130. Your goal blood pressure is less than 150/90. Please take the prescription to the pharmacy and start taking it today. Please come back on Monday, 06/05/2016 for blood pressure checkup. Please seek immediate care if you have severe headache, vision changes, numbness or weakness in the arms, speech changes, chest pain or shortness of breath or other symptoms concerning to you. 2.   Cough/acid reflux: I gave you prescription for pantoprazole. Get this prescription filled and start taking. Take it in the morning 30 minutes before breakfast.   If we did any lab work today, and the results require attention, either me or my nurse will get in touch with you. If everything is normal, you will get a letter in mail and a message via . If you don't hear from [place] in two weeks, please give [place] a call. Otherwise, we look forward to seeing you again at your next visit. If you have any questions or concerns before then, please call the clinic at [phone].  Please bring all your medications to every doctors visit  Sign up for My Chart to have easy access to your labs results, and communication with your Primary care physician.    Please check-out at the front desk before leaving the clinic.    Take Care,   Dr. Cyndia Skeeters

## 2016-06-02 NOTE — Assessment & Plan Note (Signed)
Patient reports chronic cough. Denies fever or chills or night sweats. She admits symptoms of acid reflux. -Gave prescription for Protonix 40 mg daily

## 2016-06-02 NOTE — Progress Notes (Signed)
  Subjective:    Mackenzie Taylor is a 71 y.o. old female here to establish care and discuss about hypertension. She is here with her daughter. Patient speaks Mackenzie Taylor. Patient came with an interpreter who left before the encounter due to some miscommunication. Daughter was used as Veterinary surgeon for this encounter.   HPI Hypertension: she has no PCP. [facility] She has been out of her medication for three days. Snores at night. Feels sleepy during the day. Denies headache, new vision changes, chest pain, shortness of breath, palpitation, numbness or weakness in the arms or legs. She reports chronic cough for over 10 years. She denies fever, chills or night sweats. Admits acid reflux  PMH/Problem List: has Essential hypertension and Gastroesophageal reflux disease without esophagitis on her problem list.   has a past medical history of Hypertension.  FH:  No family history on file.  [place] Social History  Substance Use Topics  . Smoking status: Never Smoker  . Smokeless tobacco: Never Used  . Alcohol use No    Review of Systems Review of systems negative except for pertinent positives and negatives in history of present illness above.     Objective:     Vitals:   06/02/16 0846 06/02/16 0920  BP: (!) 196/132 (!) 186/130  Pulse: 68   Temp: 97.5 F (36.4 C)   TempSrc: Oral   SpO2: 99%   Weight: 139 lb (63 kg)   Height: 5' 0.25" (1.53 m)     Physical Exam GEN: appears well, no apparent distress. Head: normocephalic and atraumatic  Eyes: conjunctiva without injection, sclera anicteric Oropharynx: mmm without erythema or exudation HEM: negative for cervical or periauricular lymphadenopathies CVS: RRR, nl S1&S2, no murmurs, no edema,  2+ DP & PT pulses bilaterally, negative for carotid bruits RESP: speaks in full sentence, no IWOB, good air movement bilaterally, CTAB GI: BS present & normal, soft, NTND, no guarding, no rebound MSK: no focal tenderness or notable  swelling SKIN: no apparent skin lesion NEURO: alert and oiented appropriately, no gross defecits  PSYCH: euthymic mood with congruent affect    Assessment and Plan:  Essential hypertension Patient with hypertensive urgency after being out of her medication for the last 3 days. She had no PCP in the past. No signs of end organ damage. She has no neurologic symptoms. -Printed and gave her paper prescription for her Diovan. -Emphasized start this medication as soon as possible. -BMP, lipid and TSH today -We will consider sleep study given his history of snoring and daytime sleepiness in the future -Patient to return for blood pressure check in 3 days. -Discussed return precautions as in AVS.   Gastroesophageal reflux disease without esophagitis Patient reports chronic cough. Denies fever or chills or night sweats. She admits symptoms of acid reflux. -Gave prescription for Protonix 40 mg daily   Orders Placed This Encounter  Procedures  . BASIC METABOLIC PANEL WITH GFR  . Lipid panel  . TSH    Return in about 3 days (around 06/05/2016) for RN visit for BP.  Mercy Riding, MD 06/02/16 Pager: [phone]  Precepted patient with Dr. Mingo Amber.

## 2016-06-02 NOTE — Assessment & Plan Note (Addendum)
Patient with hypertensive urgency after being out of her medication for the last 3 days. She had no PCP in the past. No signs of end organ damage. She has no neurologic symptoms. -Printed and gave her paper prescription for her Diovan. -Emphasized start this medication as soon as possible. -BMP, lipid and TSH today -We will consider sleep study given his history of snoring and daytime sleepiness in the future -Patient to return for blood pressure check in 3 days. -Discussed return precautions as in AVS.

## 2016-06-05 ENCOUNTER — Ambulatory Visit (INDEPENDENT_AMBULATORY_CARE_PROVIDER_SITE_OTHER): Payer: Medicare Other | Admitting: *Deleted

## 2016-06-05 ENCOUNTER — Encounter: Payer: Medicare Other | Admitting: Family Medicine

## 2016-06-05 VITALS — BP 148/98 | HR 74

## 2016-06-05 DIAGNOSIS — I10 Essential (primary) hypertension: Secondary | ICD-10-CM

## 2016-06-05 DIAGNOSIS — Z0130 Encounter for examination of blood pressure without abnormal findings: Secondary | ICD-10-CM

## 2016-06-05 NOTE — Progress Notes (Signed)
   Patient in nurse clinic for blood pressure check.  Patient is taking all medications as prescribed.  Patient denies chest pain, SOB, dizziness, headache, visual changes or numbness/weakness in arms/legs.  Advised patient to follow up with PCP for blood pressure and medication management.  Advised patient on return precautions regarding symptoms above.  Will forward to PCP.  Derl Barrow, RN   Today's Vitals   06/05/16 1321 06/05/16 1328  BP: (!) 150/100 (!) 148/98  Pulse: 72 74  SpO2: 94% 95%  PainSc: 2

## 2016-06-06 ENCOUNTER — Other Ambulatory Visit: Payer: Self-pay | Admitting: Student

## 2016-06-06 DIAGNOSIS — E7800 Pure hypercholesterolemia, unspecified: Secondary | ICD-10-CM | POA: Insufficient documentation

## 2016-06-06 MED ORDER — ATORVASTATIN CALCIUM 40 MG PO TABS: 40.0000 mg | ORAL_TABLET | Freq: Every day | ORAL | 1 refills | Status: DC

## 2016-06-06 NOTE — Progress Notes (Signed)
Talked to daughter about patient's cholesterol and sent prescription for atorvastatin to her pharmacy.

## 2016-06-15 ENCOUNTER — Ambulatory Visit: Payer: Medicare Other | Admitting: Family Medicine

## 2016-06-30 ENCOUNTER — Other Ambulatory Visit: Payer: Self-pay | Admitting: Student

## 2016-06-30 DIAGNOSIS — I10 Essential (primary) hypertension: Secondary | ICD-10-CM

## 2016-06-30 DIAGNOSIS — E7800 Pure hypercholesterolemia, unspecified: Secondary | ICD-10-CM

## 2016-06-30 MED ORDER — ATORVASTATIN CALCIUM 40 MG PO TABS: 40.0000 mg | ORAL_TABLET | Freq: Every day | ORAL | 1 refills | Status: DC

## 2016-06-30 MED ORDER — VALSARTAN-HYDROCHLOROTHIAZIDE 160-25 MG PO TABS
1.0000 | ORAL_TABLET | Freq: Every day | ORAL | 0 refills | Status: DC
Start: 2016-06-30 — End: 2016-08-29

## 2016-06-30 NOTE — Telephone Encounter (Signed)
Pt needs a refill on BP medication. Pt is out of medication. Pt uses [facility] on [address]. ep

## 2016-07-05 ENCOUNTER — Ambulatory Visit (INDEPENDENT_AMBULATORY_CARE_PROVIDER_SITE_OTHER): Payer: Medicare Other | Admitting: Family Medicine

## 2016-07-05 ENCOUNTER — Encounter: Payer: Self-pay | Admitting: Family Medicine

## 2016-07-05 VITALS — BP 144/100 | HR 60 | Temp 98.0°F | Ht 60.25 in | Wt 140.0 lb

## 2016-07-05 DIAGNOSIS — H7292 Unspecified perforation of tympanic membrane, left ear: Secondary | ICD-10-CM

## 2016-07-05 DIAGNOSIS — I10 Essential (primary) hypertension: Secondary | ICD-10-CM

## 2016-07-05 DIAGNOSIS — H9313 Tinnitus, bilateral: Secondary | ICD-10-CM

## 2016-07-05 DIAGNOSIS — H9193 Unspecified hearing loss, bilateral: Secondary | ICD-10-CM

## 2016-07-05 MED ORDER — AMLODIPINE BESYLATE 5 MG PO TABS: 5.0000 mg | ORAL_TABLET | Freq: Every day | ORAL | 3 refills | Status: DC

## 2016-07-05 NOTE — Assessment & Plan Note (Signed)
Continues to be uncontrolled despite dual antihypertensive therapy.  Last BMP reviewed.  Will check BMP again today, as ARB started last visit.  Norvasc 5mg  daily added to regimen.  DASH diet.  Exercise daily.  If persistently elevated, consider polysomnogram to evaluate for OSA.  Follow up in 1 week for BP check with RN.  Follow up with PCP pending this recheck.

## 2016-07-05 NOTE — Patient Instructions (Signed)
Follow up next week for blood pressure check.  Continue taking the medicine you have been for your blood pressure.  I have prescribed a new one to take as well.  You can take these both at the same time.

## 2016-07-05 NOTE — Progress Notes (Signed)
    Subjective: CC: HTN HPI: Mackenzie Taylor is a 71 y.o. female presenting to clinic today for:  Daughter provided Mackenzie Taylor translation for this visit.  See waiver in chart.  1. HTN Patient seen  06/02/16 by PCP for Hypertensive urgency.  Labs obtained at that time were WNL.  She was started on Diovan/HCTZ at that appointment and recommended to follow up in 3 days.  She had BP check by RN and this was 148/98.  She presents today for follow up.  Patient notes that they have been taking medication daily without difficulty.  She denies headaches, visual disturbance, CP, SOB, nausea, vomiting.  She notes increased urination with medication.  She exercises daily, walking.  She reports that she has been cutting her salt intake.  She reports snoring.  Denies apnea, cough, poor energy during day.  2. Tinnitus Patient reports ringing in her ears.  She reports decreased hearing. She notes that this has been going on for several years.  She occ dizziness.  She is a refugee from [place] and has not had this evaluated before.  Denies fevers, chills, ear pain, ear drainage.  Social Hx reviewed: non smoker. MedHx, medications and allergies reviewed.  Please see EMR. ROS: Per HPI  Objective: Office vital signs reviewed. BP (!) 144/100   Pulse 60   Temp 98 F (36.7 C) (Oral)   Ht 5' 0.25" (1.53 m)   Wt 140 lb (63.5 kg)   SpO2 96%   BMI 27.12 kg/m   Physical Examination:  General: Awake, alert, well nourished, well appearing female. No acute distress HEENT: Normal    Neck: No masses palpated. No lymphadenopathy    Ears: Right Tympanic membrane intact, Left TM with midline perforation. No exudate, bleeding.  No mastoid TTP.  Normal light reflex Right, no erythema, no bulging Cardio: regular rate and rhythm, S1S2 heard, no murmurs appreciated Pulm: clear to auscultation bilaterally, no wheezes, rhonchi or rales; normal work of breathing on room air Extremities: warm, well perfused, No edema, cyanosis or  clubbing  Assessment/ Plan: 71 y.o. female   Essential hypertension Continues to be uncontrolled despite dual antihypertensive therapy.  Last BMP reviewed.  Will check BMP again today, as ARB started last visit.  Norvasc 5mg  daily added to regimen.  DASH diet.  Exercise daily.  If persistently elevated, consider polysomnogram to evaluate for OSA.  Follow up in 1 week for BP check with RN.  Follow up with PCP pending this recheck.  Tinnitus aurium, bilateral - Ambulatory referral to ENT  Decreased hearing of both ears - Ambulatory referral to ENT  Perforated ear drum, left.  No evidence of infection.  Discussed signs and symptoms of infection.  For now, will watch. - Ambulatory referral to ENT  Follow up with pcp prn.  Mackenzie Norlander, DO PGY-3, [facility]

## 2016-07-06 LAB — BASIC METABOLIC PANEL
BUN/Creatinine Ratio: 24 (ref 12–28)
BUN: 21 mg/dL (ref 8–27)
CO2: 27 mmol/L (ref 18–29)
Calcium: 10.4 mg/dL — ABNORMAL HIGH (ref 8.7–10.3)
Chloride: 99 mmol/L (ref 96–106)
Creatinine, Ser: 0.88 mg/dL (ref 0.57–1.00)
GFR calc Af Amer: 78 mL/min/{1.73_m2} (ref >59)
GFR calc non Af Amer: 67 mL/min/{1.73_m2} (ref >59)
Glucose: 106 mg/dL — ABNORMAL HIGH (ref 65–99)
Potassium: 3.9 mmol/L (ref 3.5–5.2)
Sodium: 140 mmol/L (ref 134–144)

## 2016-07-12 ENCOUNTER — Ambulatory Visit (INDEPENDENT_AMBULATORY_CARE_PROVIDER_SITE_OTHER): Payer: Medicare Other | Admitting: *Deleted

## 2016-07-12 VITALS — BP 138/100 | HR 70 | Ht 60.25 in | Wt 141.0 lb

## 2016-07-12 DIAGNOSIS — Z0130 Encounter for examination of blood pressure without abnormal findings: Secondary | ICD-10-CM

## 2016-07-12 DIAGNOSIS — I10 Essential (primary) hypertension: Secondary | ICD-10-CM

## 2016-07-12 NOTE — Progress Notes (Signed)
   Patient in nurse clinic for blood pressure check.  Patient denies chest pain, SOB, dizziness, n/v, visual changes.  Patient reported having back pain, PCP is aware and a cough.  Per patient's daughter, patient has been coughing for 10 years due to a medication.  Pt has several coughing spells during visit.  Advised patient to schedule a follow up appointment for blood pressure and cough with PCP. Patient stated understanding.  Interpreter with Language [employer] ([place]).  Will forward to PCP.  Derl Barrow, RN

## 2016-07-28 ENCOUNTER — Ambulatory Visit (INDEPENDENT_AMBULATORY_CARE_PROVIDER_SITE_OTHER): Payer: Medicare Other | Admitting: Student

## 2016-07-28 ENCOUNTER — Encounter: Payer: Self-pay | Admitting: Student

## 2016-07-28 VITALS — BP 140/100 | HR 77 | Temp 98.1°F | Wt 140.2 lb

## 2016-07-28 DIAGNOSIS — T7840XA Allergy, unspecified, initial encounter: Secondary | ICD-10-CM

## 2016-07-28 DIAGNOSIS — Z1159 Encounter for screening for other viral diseases: Secondary | ICD-10-CM

## 2016-07-28 DIAGNOSIS — R05 Cough: Secondary | ICD-10-CM

## 2016-07-28 DIAGNOSIS — R059 Cough, unspecified: Secondary | ICD-10-CM

## 2016-07-28 DIAGNOSIS — R0683 Snoring: Secondary | ICD-10-CM | POA: Diagnosis not present

## 2016-07-28 DIAGNOSIS — I10 Essential (primary) hypertension: Secondary | ICD-10-CM | POA: Diagnosis not present

## 2016-07-28 MED ORDER — CETIRIZINE HCL 10 MG PO TABS: 10.0000 mg | ORAL_TABLET | Freq: Every day | ORAL | 11 refills | Status: AC

## 2016-07-28 MED ORDER — AMLODIPINE BESYLATE 10 MG PO TABS: 10.0000 mg | ORAL_TABLET | Freq: Every day | ORAL | 0 refills | Status: DC

## 2016-07-28 NOTE — Patient Instructions (Addendum)
It was great seeing you today! We have addressed the following issues today  1. Blood pressure: Your blood pressure is 140/100. I have increased your amlodipine to 10 mg daily. Continue Diovan/HCTZ as well. I have also ordered a referral to sleep clinic for snoring. Some will get in touch with you over the next couple of weeks. I recommened follow up in one month for your blood pressure.  2. Cough: we gave you prescription for allergy medicine. Try this. Continue the acid reflex medicine as well. If it doesn't improve we will get an x-ray of your chest.   If we did any lab work today, and the results require attention, either me or my nurse will get in touch with you. If everything is normal, you will get a letter in mail and a message via . If you don't hear from [place] in two weeks, please give [place] a call. Otherwise, we look forward to seeing you again at your next visit. If you have any questions or concerns before then, please call the clinic at [phone].  Please bring all your medications to every doctors visit  Sign up for My Chart to have easy access to your labs results, and communication with your Primary care physician.    Please check-out at the front desk before leaving the clinic.    Take Care,   Dr. Cyndia Skeeters            Colon Cancer  People with early colon cancer usually have no warning signs or symptoms.  If found early, most patients can be cured, but if found when it has already spread, the chance of survival is not as good.  Colon cancer is the second most common cause of concern is in the [place] with over 56,000 deaths from colon cancer in 2005  Colon cancer is a common, treatable disease. Screening tests can find a cancer  early, before you have symptoms, and make it more likely that you will survive the disease.  Who needs to be tested? If you are age 9-75 yrs, you should be tested for colon cancer.  Ways to be tested:  A colonoscopy the best test to detect colon  cancer. It requires you to drink a bowel preparation to clean out your colon before the test. During this test, a tube with a camera inserted into your rectum and examines your entire colon. You can be given medicine to make you sleepy during the exam. Therefore, you will not be able to drive immediately after the test. There is a small risk of bowel injury during the test.   Stool cards that you can take home and take a sample of your stool is another option. The cards are not as good as colonoscopy at detecting cancer, but the tests are easier and cheaper.   To schedule the colonoscopy, you can call one of the 3 options below:  [facility]. Phone number: [phone]  [facility]. Phone number: [phone]  [facility]: Phone number [phone]

## 2016-07-28 NOTE — Progress Notes (Signed)
Subjective:    Mackenzie Taylor is a 71 y.o. old female here for follow up on hypertension. She is here with personal interpretor Mackenzie Taylor  HPI  Hypertension: started on Diovan-HCTZ 160/25 mg daily on 06/02/2016. BP continued to be elevated. So amlodipine was added about a month ago. She continues to have elevated blood pressure. She doesn't check her blood pressure at home. She reports good compliance with her medication. Admits snoring but denies daytime sleepiness or fatigue. She denies heat or cold intolerance. Denies headache, vision change, chest pain, shortness of breath, leg swelling or focal weakness, numbness or tingling.   Cough: for one year. However, she reported having this for over 10 years when she was last here about a month ago. Cough is productive with whitish phlegm. Denies hemoptysis, shortness of breath, chest pain, fever, night sweat, unintentional weight loss. She says her cough get worse around this period of the year. Admits heartburn and sense of needing to clear her throat. She has been on Protonix for the last 6 weeks which helps with heartburn. Reports taking it every day. Lived in [place] since 1996. No recent international travel.   PMH/Problem List: has Essential hypertension; Gastroesophageal reflux disease without esophagitis; Pure hypercholesterolemia; Cough; and Snoring on her problem list.   has a past medical history of Hypertension.  FH:  No family history on file.  [place] Social History  Substance Use Topics  . Smoking status: Never Smoker  . Smokeless tobacco: Never Used  . Alcohol use No    Review of Systems Review of systems negative except for pertinent positives and negatives in history of present illness above.     Objective:     Vitals:   07/28/16 1354 07/28/16 1508  BP: (!) 150/100 (!) 140/100  Pulse: 77   Temp: 98.1 F (36.7 C)   TempSrc: Oral   SpO2: 97%   Weight: 140 lb 3.2 oz (63.6 kg)     Physical Exam GEN: appears well, no apparent  distress. Head: normocephalic and atraumatic  Eyes: conjunctiva without injection, sclera anicteric Oropharynx: mmm without erythema or exudation HEM: negative for cervical or periauricular lymphadenopathies CVS: RRR, nl S1&S2, no murmurs, no edema, radial, DP & PT pulses 2+ bilaterally, negative for carotid bruits RESP: speaks in full sentence, no IWOB, good air movement bilaterally, CTAB GI: BS present & normal, soft, NTND MSK: no focal tenderness or notable swelling SKIN: no apparent skin lesion ENDO: negative thyromegally NEURO: alert and oiented appropriately, no gross defecits  PSYCH: euthymic mood with congruent affect    Assessment and Plan:  Essential hypertension BP still not at goal. BP 140/100 on repeat. She is now on Diovan/HCTZ 160/25 daily and amlodipine 5 mg daily. Wonder if she has OSA. She admits snoring a lot. TSH and renal function has been normal.  Increased amlodipine to 10 mg daily  Referral for sleep study  Repeat BMP today  Follow up in one month.   Cough Chronic. Admits reflex and sense of need to clear her throat frequently. She says it gets worse around this time of the year. She is already on PPI. No red flags.   Added Zyrtec to see if this helps  Continue Protonix  If no improvement, will get CXR  Snoring Referral to sleep clinic  Orders Placed This Encounter  Procedures  . Basic metabolic panel  . Hepatitis C antibody  . Ambulatory referral to Sleep Studies    Referral Priority:   Routine    Referral  Type:   Consultation    Referral Reason:   Specialty Services Required    Number of Visits Requested:   1    Return in about 1 month (around 08/27/2016) for HTN and annual physical.  Mercy Riding, MD 07/29/16 Pager: [phone]

## 2016-07-29 ENCOUNTER — Encounter: Payer: Self-pay | Admitting: Student

## 2016-07-29 DIAGNOSIS — R0683 Snoring: Secondary | ICD-10-CM | POA: Insufficient documentation

## 2016-07-29 DIAGNOSIS — R05 Cough: Secondary | ICD-10-CM | POA: Insufficient documentation

## 2016-07-29 DIAGNOSIS — R059 Cough, unspecified: Secondary | ICD-10-CM | POA: Insufficient documentation

## 2016-07-29 LAB — BASIC METABOLIC PANEL
BUN/Creatinine Ratio: 20 (ref 12–28)
BUN: 20 mg/dL (ref 8–27)
CO2: 24 mmol/L (ref 18–29)
Calcium: 10.5 mg/dL — ABNORMAL HIGH (ref 8.7–10.3)
Chloride: 98 mmol/L (ref 96–106)
Creatinine, Ser: 0.98 mg/dL (ref 0.57–1.00)
GFR calc Af Amer: 68 mL/min/{1.73_m2} (ref >59)
GFR calc non Af Amer: 59 mL/min/{1.73_m2} — ABNORMAL LOW (ref >59)
Glucose: 89 mg/dL (ref 65–99)
Potassium: 4 mmol/L (ref 3.5–5.2)
Sodium: 138 mmol/L (ref 134–144)

## 2016-07-29 LAB — HEPATITIS C ANTIBODY: Hep C Virus Ab: <0.1 s/co ratio (ref 0.0–0.9)

## 2016-07-29 NOTE — Progress Notes (Signed)
Discussed about her BMP which is within normal range except for mild creatinine bump from 0.88 to 0.98. No changes to medications. Follow up in a month.  Patient asked for Dr's. Letter for her citizenship application. Not sure what kind of letter. Discussed this with Dr. Mingo Amber who knows more about this. He recommended she call [employer] as they usually handle this and contact Dr. as needed. I called and shared this, her test results and our plan from yesterday's visit with her daughter.

## 2016-07-29 NOTE — Assessment & Plan Note (Signed)
Chronic. Admits reflex and sense of need to clear her throat frequently. She says it gets worse around this time of the year. She is already on PPI. No red flags.   Added Zyrtec to see if this helps  Continue Protonix  If no improvement, will get CXR

## 2016-07-29 NOTE — Assessment & Plan Note (Signed)
Referral to sleep clinic

## 2016-07-29 NOTE — Assessment & Plan Note (Signed)
BP still not at goal. BP 140/100 on repeat. She is now on Diovan/HCTZ 160/25 daily and amlodipine 5 mg daily. Wonder if she has OSA. She admits snoring a lot. TSH and renal function has been normal.  Increased amlodipine to 10 mg daily  Referral for sleep study  Repeat BMP today  Follow up in one month.

## 2016-08-14 DIAGNOSIS — Z1211 Encounter for screening for malignant neoplasm of colon: Secondary | ICD-10-CM | POA: Diagnosis not present

## 2016-08-24 ENCOUNTER — Other Ambulatory Visit: Payer: Self-pay | Admitting: *Deleted

## 2016-08-24 ENCOUNTER — Encounter: Payer: Self-pay | Admitting: Neurology

## 2016-08-24 ENCOUNTER — Ambulatory Visit (INDEPENDENT_AMBULATORY_CARE_PROVIDER_SITE_OTHER): Payer: Medicare Other | Admitting: Neurology

## 2016-08-24 VITALS — BP 177/109 | HR 74 | Resp 20 | Ht 60.0 in | Wt 140.0 lb

## 2016-08-24 DIAGNOSIS — R0683 Snoring: Secondary | ICD-10-CM

## 2016-08-24 DIAGNOSIS — I10 Essential (primary) hypertension: Secondary | ICD-10-CM | POA: Insufficient documentation

## 2016-08-24 DIAGNOSIS — G4719 Other hypersomnia: Secondary | ICD-10-CM | POA: Diagnosis not present

## 2016-08-24 NOTE — Progress Notes (Signed)
SLEEP MEDICINE CLINIC   Provider:  Larey Seat, Mackenzie Taylor  Primary Care Physician:  Mackenzie Riding, MD   Referring Provider: Mercy Riding, MD    Chief Complaint  Patient presents with  . New Patient (Initial Visit)    snoring, never had a sleep study    HPI:  Mackenzie Taylor is a 71 y.o. female  from [place], seen here as in a referral  from Dr. Cyndia Taylor for SLEEP CONSULT .  Mrs. Mackenzie Taylor presents as a referral for visit: Family practice center where she is treated for hypertension since February on a mixture of Diovan, hydrochlorothiazide and Norvasc and she is followed for gastroesophageal reflux disease with esophagitis, pure hypercholesterolemia, but she presented with coughing and snoring on the problem list. Her family especially her daughter reports that the patient can go to sleep quite promptly and in the midst of a conversation, of babysitting, watching TV and this very high degree of sleepiness may cause some safety issues at home. She can sleep in any situation anytime of the day within 20 or 30 minutes of being physically not active.  Chief complaint according to patient :   Sleep habits are as follows: She reads her bible before going to bed, and calms sown to get ready fro restful sleep. She spends her afternoon and early evening's of summer with  garden work, and she usually is in bed before 8 PM. She states that she is not struggling to go to sleep at all, she is promptly asleep when she retreats to bed. She is retired and takes care of her grandchildren. Often she falls asleep when she rocks a grandchild. She may have one bathroom break at night, her dog will wake her up to three times- each time can go back to sleep easily. She rises around 6 AM and she has no physical symptoms of discomfort feels refreshed restored and ready to go. Her daughter has noted her to snore loudly- and sometimes to breath irregularly. She lives in a 3 generation household.   Sleep medical history and  family sleep history:  O recollection of sleep habits in childhood. No sleep walking, terrors or enuresis.   Social history:  Retired, lifelong nonsmoker never used tobacco products, no history of alcohol use or abuse, she drinks a lot of coffee and tea, 3 - 5 a day with sugar and cream or condensed milk. . Lives with daughter , son in law  and grandchildren .  Review of Systems: Out of a complete 14 system review, the patient complains of only the following symptoms, and all other reviewed systems are negative.  Snoring, EDS, coughing -(deceased husband has had TB ) Epworth score 24/24  , Fatigue severity score 29  , depression score n/a    Social History   Social History  . Marital status: Divorced    Spouse name: N/A  . Number of children: N/A  . Years of education: N/A   Occupational History  . Not on file.   Social History Main Topics  . Smoking status: Never Smoker  . Smokeless tobacco: Never Used  . Alcohol use No  . Drug use: No  . Sexual activity: Not on file   Other Topics Concern  . Not on file   Social History Narrative  . No narrative on file    No family history on file.  Past Medical History:  Diagnosis Date  . Hypertension     Past Surgical History:  Procedure Laterality Date  . HAND SURGERY    . WISDOM TOOTH EXTRACTION      Current Outpatient Prescriptions  Medication Sig Dispense Refill  . amLODipine (NORVASC) 10 MG tablet Take 1 tablet (10 mg total) by mouth daily. 30 tablet 0  . ASPIRIN 81 PO Take by mouth.    Marland Kitchen atorvastatin (LIPITOR) 40 MG tablet Take 1 tablet (40 mg total) by mouth daily. 30 tablet 1  . cetirizine (ZYRTEC) 10 MG tablet Take 1 tablet (10 mg total) by mouth daily. 30 tablet 11  . pantoprazole (PROTONIX) 40 MG tablet Take 1 tablet (40 mg total) by mouth daily. 30 tablet 3  . valsartan-hydrochlorothiazide (DIOVAN-HCT) 160-25 MG tablet Take 1 tablet by mouth daily. 30 tablet 0   No current facility-administered medications  for this visit.     Allergies as of 08/24/2016  . (No Known Allergies)    Vitals: BP (!) 177/109   Pulse 74   Resp 20   Ht 5' (1.524 m)   Wt 140 lb (63.5 kg)   BMI 27.34 kg/m  Last Weight:  Wt Readings from Last 1 Encounters:  08/24/16 140 lb (63.5 kg)   WUJ:WJXB mass index is 27.34 kg/m.     Last Height:   Ht Readings from Last 1 Encounters:  08/24/16 5' (1.524 m)    Physical exam:  General: The patient is awake, alert and appears not in acute distress. The patient is well groomed. Head: Normocephalic, atraumatic. Neck is supple. Mallampati 3 - red and puffy uvula.   Red and inflamed upper airway.  neck circumference: 13. 25  Nasal airflow , TMJ is not  evident . Retrognathia is mild .  Cardiovascular:  Regular rate and rhythm, without  murmurs or carotid bruit, and without distended neck veins. Respiratory: Lungs with rhonci - mucous.  are clear to auscultation. Skin:  Without evidence of edema, or rash Trunk: BMI is 27. The patient's posture is erect   Neurologic exam : The patient is awake and alert, oriented to place and time.   Memory subjective  described as intact.    Attention span & concentration ability appears normal.  Speech is fluent,  without  dysarthria, dysphonia or aphasia.  Mood and affect are appropriate.  Cranial nerves: Pupils are equal and briskly reactive to light. Funduscopic exam without  evidence of pallor or edema. Extraocular movements  in vertical and horizontal planes intact and without nystagmus. Visual fields by finger perimetry are intact. Hearing to finger rub intact. Facial sensation intact to fine touch.  Facial motor strength is symmetric and tongue and uvula move midline. Shoulder shrug was symmetrical.   Motor exam:   Normal tone, muscle bulk and symmetric strength in all extremities.  Sensory:  Fine touch, pinprick and vibration were tested in all extremities. Proprioception tested in the upper extremities was  normal.  Coordination: Finger-to-nose maneuver  normal without evidence of ataxia, dysmetria or tremor.  Gait and station: Patient walks without assistive device and is able unassisted to climb up to the exam table. Strength within normal limits.  Stance is stable and normal.   Assessment:  After physical and neurologic examination, review of laboratory studies,  Personal review of imaging studies, reports of other /same  Imaging studies, results of polysomnography and / or neurophysiology testing and pre-existing records as far as provided in visit., my assessment is   1)  my goal for the patient is to be tested for the presence of obstructive sleep apnea,  her excessive daytime sleepiness can be an indicator of such. Also that she has not responded to her initial treatment with antihypertensives as expected and now uses a 3 medication combination.  I would like for the patient to reduce her caffeine intake to the morning hours before lunch. This will help hypertension, it may also positively influence the frequency of bathroom breaks and of palpitations.  2) Mrs. Arteaga is struggling with a cough that has been present for at least 8 weeks, probably a year. I would like for her to have a gold TB test, and for her primary care physician to consider a PA and lateral chest x-ray.    The patient was advised of the nature of the diagnosed disorder , the treatment options and the  risks for general health and wellness arising from not treating the condition.   I spent more than 45 minutes of face to face time with the patient. A translator was present.   Greater than 50% of time was spent in counseling and coordination of care. We have discussed the diagnosis and differential and I answered the patient's questions.    Plan:  Treatment plan and additional workup :  A SPLIT night polysomnography. Daughter will accompany her as Optometrist.    MD follow up   Larey Seat, MD 2/70/6237, 6:28 AM   Certified in Neurology by ABPN Certified in [place] by [facility] [address] [place], [place] [address]

## 2016-08-24 NOTE — Patient Instructions (Signed)
Ng?ng th? khi ng? (Sleep Apnea) Ng??ng th?? khi ngu? la? m?t ti?nh tra?ng trong ? c ng??ng th? ho??c th? nng trong lu?c ngu?. Ca?c c?n ng??ng th?? khi ngu? th???ng ke?o da?i 10 giy ho??c lu h?n va? cc c?n ny co? th? di?n ra t??i 20 l?n trong m?t gi??. Ng??ng th?? khi ngu? la?m gia?n ?oa?n gi?c ngu? va? ca?n tr?? s?? nghi? ng?i c?n thi?t cu?a c? th? quy? vi?. Ti?nh tra?ng na?y co? th? la?m t?ng nguy c? b? m?t s? v?n ?? s??c kho?e nh?t ?i?nh, bao g?m:  Nh?i mu c? tim.  ??t qu?Marland Kitchen  Bo ph.  Ti?u ???ng.  Suy tim.  Nh?p tim khng ??u. Co? ba loa?i ng??ng th?? khi ngu?:  Ng?ng th? do t?c ngh?n khi ng?. Loa?i ng??ng th?? na?y la? do ????ng th?? bi? t??c ho??c bi? xe?p gy ra.  Ng??ng th?? trung ??ng. Loa?i ng??ng th?? na?y xa?y ra khi ph?n na?o ki?m soa?t h h?p khng g??i ti?n hi?u ?u?ng ??n ca?c c? ki?m soa?t h h?p.  Ng??ng th?? h?n h??p. ?y la? s?? k?t h??p gi??a ng??ng th?? do t??c nghe?n va? ng??ng th?? trung ??ng. NGUYN NHN Nguyn nhn ph? bi?n nh?t cu?a ti?nh tra?ng na?y la? ????ng th?? bi? xe?p ho??c b? t??c. ????ng th?? co? th? b? xe?p ho??c bi? t??c n?u:  Ca?c c? ?? h?ng quy? vi? th? l?ng m?t cch b?t th???ng.  L???i va? ami?an quy? vi? l??n h?n bi?nh th???ng.  Quy? vi? bi? th??a cn.  ????ng th?? cu?a quy? vi? nho? h?n bi?nh th???ng. CC Y?U T? NGUY C? Tnh tr?ng ny hay x?y ra h?n ? nh?ng ng??i:  Bi? th??a cn.  Ht thu?c.  Co? ????ng th?? nho? h?n bi?nh th???ng.  Cao tu?i.  La? nam gi??i.  U?ng r??u.  Du?ng thu?c gia?m ?au ho??c thu?c an th?n.  C ti?n s? gia ?nh b? b?nh ng??ng th?? khi ngu?. TRI?U CH?NG Nh?ng tri?u ch?ng c?a tnh tr?ng ny bao g?m:  Kho? ngu? ngon gi?c.  Bu?n ngu? va? m?t mo?i va?o ban nga?y.  D? b? kch thch.  Nga?y to.  ?au ??u va?o bu?i sa?ng.  Kh t?p trung.  Hay qun.  Gia?m ham mu?n ti?nh du?c.  Bu?n ngu? khng ro? nguyn nhn.  Thay ??i tm tr?ng.  Thay ??i tnh  cch.  Ca?m th?y tr?m c?m.  Th?c d?y th??ng xuyn trong ?m ?? ?i ti?u.  Mi?ng kh.  ?au h?ng. CH?N ?ON Tnh tr?ng ny c th? ???c ch?n ?on b?ng:  Khai thc b?nh s?Jacqulyn Liner th?c th?.  M?t loa?t cc ki?m tra ???c th??c hi?n khi quy? vi? ngu? (th?? nghi?m khi ngu?). Nh??ng ki?m tra na?y th???ng ???c th??c hi?n khi ngu? trong pho?ng kha?m, nh?ng cu?ng co? th? ???c th??c hi?n ?? nha?. ?I?U TR? Vi?c ?i?u tri? ti?nh tra?ng na?y nh??m mu?c tiu duy tri? h h?p bi?nh th???ng va? la?m gia?m ca?c tri?u ch??ng trong khi ngu?Marland Kitchen No? co? th? bao g?m vi?c qua?n ly? ca?c v?n ?? s??c kho?e a?nh h???ng ??n h h?p, ch??ng ha?n nh? huy?t a?p cao ho?c be?o phi?. ?i?u tr? c th? bao g?m:  Ngu? n??m nghing.  S?? du?ng m?t lo?i thu?c la?m thng mu?i n?u quy? vi? bi? nga?t mu?i.  Tra?nh s?? du?ng cc ch?t gy tr?m c?m, bao g?m r???u, thu?c an th?n va? thu?c m.  Gi?m cn n?u qu v? th?a cn.  Thay ??i ch? ?? ?n.  Bo? hu?t thu?c la?Marland Kitchen  S?? du?ng m?t du?ng cu? khai thng ????  ng th?? khi quy? vi? ngu?, ch??ng ha?n:  Du?ng cu? ?? mi?ng. ?y la? m?t du?ng cu? ????c thi?t k? ?? ???t v??a ?? mi?ng ?? ??y ha?m d???i cu?a quy? vi? v? phi?a tr???c.  Ma?y a?p l??c ????ng th?? d??ng lin tu?c (CPAP). Thi?t bi? na?y chuy?n  xy ??n ????ng th? cu?a quy? vi? qua m?t m??t na?Marland Kitchen  Ma?y a?p l??c ????ng th?? d??ng ?? thi? th?? ra (EPAP) qua m?i. Thi?t bi? na?y co? ca?c van ??t va?o t??ng l? mu?i.  Ma?y a?p l??c ????ng th?? d??ng hai thi? (BPAP). Thi?t bi? na?y chuy?n  xy ??n ????ng th?? cu?a quy? vi? qua m?t m??t na?Marland Kitchen  Ph?u thu?t n?u ca?c bi?n pha?p ?i?u tri? kha?c khng co? ta?c du?ng. Trong lu?c la?m ph?u thu?t, m th??a ????c c??t bo? ?? lm cho ????ng th? r?ng h?n. ?i?u quan tro?ng la? pha?i ?i?u tri? b?nh ng??ng th?? khi ngu?Marland Kitchen N?u khng ?i?u tri?, b?nh na?y co? th? d?n ??n:  Huy?t p cao.  B?nh ??ng m?ch vnh.  (Nam gi??i) Khng th? ?a?t ????c  ho??c truy tri? c??ng d??ng (b?t l??c).  Gia?m kha? n?ng suy nghi?. H??NG D?N CH?M [place] T?I NH  Th?c hi?n b?t k? thay ??i na?o v? l?i s?ng theo khuy?n ngh? c?a chuyn gia ch?m [place] s?c kh?e.  p d?ng ch? ?? ?n u?ng la?nh ma?nh, cn b?ng.  Ch? s? d?ng thu?c khng c?n k ??n v thu?c c?n k ??n theo ch? d?n c?a chuyn gia ch?m [place] s?c kh?e.  Tra?nh s?? du?ng thu?c la?m gia?m ?au, bao g?m r???u, thu?c an th?n va? thu?c gi?m ?au gy nghi?n.  Th??c hi?n ca?c b???c gi?m cn n?u qu v? th?a cn.  N?u quy? vi? ????c cho du?ng m?t du?ng cu? khai thng ????ng th?? khi quy? vi? ngu?, ha?y s?? du?ng theo chi? d?n cu?a chuyn gia ch?m so?c s??c kho?e.  Khng s? d?ng b?t k? s?n ph?m thu?c l no, ch??ng ha?n thu?c l d?ng ht, thu?c l d?ng nhai v thu?c l ?i?n t?. N?u qu v? c?n gip ?? ?? cai thu?c, hy h?i chuyn gia ch?m [place] s?c kh?e.  Tun th? t?t c? cc cu?c h?n khm l?i theo ch? d?n c?a chuyn gia ch?m [place] s?c kh?e. ?i?u ny c vai tr quan tr?ng. ?I KHM N?U:  Du?ng cu? ma? quy? vi? ????c nh?n ?? khai thng ????ng th?? khi ngu? gy c?m gic kho? chi?u ho??c co? ve? nh? khng hoa?t ??ng.  Cc tri?u ch?ng c?a qu v? khng c?i thi?n.  Tri?u ch?ng c?a qu v? tr?m tr?ng h?n. NGAY L?P T?C ?I KHM N?U:  Qu v? b? ?au ng?c.  Qu v? b? kh th?.  Quy? vi? th?y kho? chi?u ?? l?ng, ca?nh tay ho??c bu?ng.  Qu v? ni kh kh?n.  Qu v? b? y?u ? m?t bn c?a c? th?.  M??t quy? vi? bi? x? xu?ng. Nh?ng tri?u ch?ng ny c th? l m?t v?n ?? nghim tr?ng c?n c?p c?u. Khng ch? xem tri?u ch?ng c h?t khng. Hy ?i khm ngay l?p t?c. G?i cho d?ch v? c?p c?u t?i ??a ph??ng (911 ? [place]?). Khng t? li xe ??n b?nh vi?n. Thng tin ny khng nh?m m?c ?ch thay th? cho l?i khuyn m chuyn gia ch?m [place] s?c kh?e ni v?i qu v?. Hy b?o ??m qu v? ph?i th?o lu?n b?t k? v?n ?? g m qu v? c v?i chuyn gia ch?m [place] s?c kh?e c?a qu v?. Document Released: 09/26/2011 Document Revised: 07/19/2015  Document Reviewed: 01/04/2015 Elsevier Interactive Patient Education  2017 [employer]. Sleep Studies A sleep study (polysomnogram) is a series of tests done while you are sleeping. It can show how well you sleep. This can help your health care provider diagnose a sleep disorder and show how severe your sleep disorder is. A sleep study may lead to treatment that will help you sleep better and prevent other medical problems caused by poor sleep. If you have a sleep disorder, you may also be at risk for:  Sleep-related accidents.  High blood pressure.  Heart disease.  Stroke.  Other medical conditions. Sleep disorders are common. Your health care provider may suspect a sleep disorder if you:  Have loud snoring most nights.  Have brief periods when you stop breathing at night.  Feel sleepy on most days.  Fall asleep suddenly during the day.  Have trouble falling asleep or staying asleep.  Feel like you need to move your legs when trying to fall asleep.  Have dreams that seem very real shortly after falling asleep.  Feel like you cannot move when you first wake up. Which tests will I need to have? Most sleep studies last all night and include these tests:  Recordings of your brain activity.  Recordings of your eye movements.  Recording of your heart rate and rhythm.  Blood pressure readings.  Readings of the amount of oxygen in your blood.  Measurements of your chest and belly movement as you breathe during sleep. If you have signs of the sleep disorder called sleep apnea during your test, you may get a mask to wear for the second half of the night.  The mask provides continuous positive airway pressure (CPAP). This may improve sleep apnea significantly.  You will then have all tests done again with the mask in place to see if your measurements and recordings change. How are sleep studies done? Most sleep studies are done over one full night of sleep.  You will  arrive at the study center in the evening and can go home in the morning.  Bring your pajamas and toothbrush.  Do not have caffeine on the day of your sleep study.  Your health care provider will let you know if you need to stop taking any of your regular medicines before the test. To do the tests included in a polysomnogram, you will have:  Round, sticky patches with sensors attached to recording wires (electrodes) placed on your scalp, face, chest, and limbs.  Wires from all the electrodes and sensors run from your bed to a computer. The wires can be taken off and put back on if you need to get out of bed to go to the bathroom.  A sensor placed over your nose to measure airflow.  A finger clip put on one finger to measure your blood oxygen level.  A belt around your belly and a belt around your chest to measure breathing movements. Where are sleep studies done? Sleep studies are done at sleep centers. A sleep center may be inside a hospital, office, or clinic. The room where you have the study may look like a hospital room or a hotel room. The health care providers doing the study may come in and out of the room during the study. Most of the time, they will be in another room monitoring your test. How is information from sleep studies helpful? A polysomnogram can be used along with your medical history and a physical exam to diagnose conditions,  such as:  Sleep apnea.  Restless legs syndrome.  Sleep-related seizure disorders.  Sleep-related movement disorders. A medical doctor who specializes in sleep will evaluate your sleep study. The specialist will share the results with your primary health care provider. Treatments based on your sleep study may include:  Improving your sleep habits (sleep hygiene).  Wearing a CPAP mask.  Wearing an oral device at night to improve breathing and reduce snoring.  Taking medicine for:  Restless legs syndrome.  Sleep-related seizure  disorder.  Sleep-related movement disorder. This information is not intended to replace advice given to you by your health care provider. Make sure you discuss any questions you have with your health care provider. Document Released: 10/01/2002 Document Revised: 11/21/2015 Document Reviewed: 06/02/2013 Elsevier Interactive Patient Education  2017 [employer].

## 2016-08-25 MED ORDER — AMLODIPINE BESYLATE 10 MG PO TABS: 10.0000 mg | ORAL_TABLET | Freq: Every day | ORAL | 0 refills | Status: DC

## 2016-08-29 ENCOUNTER — Other Ambulatory Visit: Payer: Self-pay | Admitting: Internal Medicine

## 2016-08-29 DIAGNOSIS — I10 Essential (primary) hypertension: Secondary | ICD-10-CM

## 2016-08-29 NOTE — Telephone Encounter (Signed)
Daughter is calling for her mother to request a refill on her BP medication. jw

## 2016-09-11 ENCOUNTER — Encounter: Payer: Self-pay | Admitting: Student

## 2016-09-11 ENCOUNTER — Ambulatory Visit (INDEPENDENT_AMBULATORY_CARE_PROVIDER_SITE_OTHER): Payer: Medicare Other | Admitting: Student

## 2016-09-11 VITALS — BP 130/100 | HR 72 | Temp 97.5°F | Ht 60.0 in | Wt 139.6 lb

## 2016-09-11 DIAGNOSIS — Z0000 Encounter for general adult medical examination without abnormal findings: Secondary | ICD-10-CM | POA: Insufficient documentation

## 2016-09-11 DIAGNOSIS — H7292 Unspecified perforation of tympanic membrane, left ear: Secondary | ICD-10-CM | POA: Diagnosis not present

## 2016-09-11 DIAGNOSIS — R059 Cough, unspecified: Secondary | ICD-10-CM

## 2016-09-11 DIAGNOSIS — D509 Iron deficiency anemia, unspecified: Secondary | ICD-10-CM

## 2016-09-11 DIAGNOSIS — E2839 Other primary ovarian failure: Secondary | ICD-10-CM | POA: Diagnosis not present

## 2016-09-11 DIAGNOSIS — I10 Essential (primary) hypertension: Secondary | ICD-10-CM

## 2016-09-11 DIAGNOSIS — R05 Cough: Secondary | ICD-10-CM | POA: Diagnosis not present

## 2016-09-11 DIAGNOSIS — R053 Chronic cough: Secondary | ICD-10-CM

## 2016-09-11 MED ORDER — VALSARTAN-HYDROCHLOROTHIAZIDE 320-25 MG PO TABS: 1.0000 | ORAL_TABLET | Freq: Every day | ORAL | 0 refills | Status: DC

## 2016-09-11 NOTE — Assessment & Plan Note (Signed)
No signs and symptoms of infection. No apparent discharge

## 2016-09-11 NOTE — Assessment & Plan Note (Addendum)
BP elevated to 130/100 on repeat. Reports good compliance with amlodipine and Diovan/HCTZ. Patient was referred for sleep study. Neurology is arranging this.  -Increased her Diovan to 320 mg -BMP today and follow-up in 2 weeks. We will check blood pressure and repeat BMP.

## 2016-09-11 NOTE — Patient Instructions (Addendum)
It was great seeing you today! We have addressed the following issues today 1. Cough: I have ordered a chest x-ray. You can have this done at [facility] today 2.   Blood pressure: Your blood pressure is 130/100. Your goal blood pressure is less than 150/90 mmHg. Continue taking your medications. Keep up with your exercise and going to gym. Recommend follow-up in 2 weeks.  3.   Bone density screening: I have ordered this. Someone will get in touch with you about this over the next couple of weeks 4.   Breast cancer screening: Call the number we gave you to schedule for this 5.   Cervical cancer screening: Please schedule a follow-up appointment for this in the next 2-3 weeks 6.   Vision issues: I recommend you go back and see your eye doctor.  7.   It is lso recommended you see a dentist at least twice a year.    If we did any lab work today, and the results require attention, either me or my nurse will get in touch with you. If everything is normal, you will get a letter in mail and a message via . If you don't hear from [place] in two weeks, please give [place] a call. Otherwise, we look forward to seeing you again at your next visit. If you have any questions or concerns before then, please call the clinic at [phone].  Please bring all your medications to every doctors visit  Sign up for My Chart to have easy access to your labs results, and communication with your Primary care physician.    Please check-out at the front desk before leaving the clinic.    Take Care,   Dr. Cyndia Skeeters

## 2016-09-11 NOTE — Assessment & Plan Note (Addendum)
Patient reports going to gym and exercising. She also swims. Encouraged her to keep up. Daughter says she had colonoscopy about 4 days ago. Will follow-up on results. Ordered DEXA scan today. Patient to schedule this and mammogram.  Patient to return in 2 weeks for Pap smear. Not sure if she ever had Pap smear Needs pneumonia vaccine & Zostavax. We'll address this at next visit. Recommend follow-up with the eye doctor and dentist regularly.

## 2016-09-11 NOTE — Progress Notes (Signed)
Subjective:  In person interpreter by the name Mackenzie Taylor was used for this encounter.    Mackenzie Taylor is a 71 y.o. old female here for annual physical. She is here with her daughter and in person interpreter  HPI Cough: this is a chronic issue. Improved. Cough is productive with clear mucus. Denies hemoptysis. No night sweat or unintentional weight loss. Denies chest pain or shortness of breasts.. She arrived the [place] in 1996.  Denies leaving the country since then. Was treated for TB at around 2009. She says she completed 9 months course of treatment. Denies smoking. Admits chewable tobacco use.   Hypertension: she is on amlodipine 10 mg daily and Diovan 160/25 mg daily. Denies dizziness, chest pain or shortness of breath. Had history of headache in the past that has improved since she started antihypertensive medications. Denies acute vision changes.  PMH/Problem List: has Essential hypertension; Gastroesophageal reflux disease without esophagitis; Pure hypercholesterolemia; Cough; Snoring; Excessive daytime sleepiness; Hypertension goal BP (blood pressure) < 130/80; Perforated eardrum, left; and Routine adult health maintenance on her problem list.   has a past medical history of Hypertension.  [facility]  No family history on file.  [place] Social History  Substance Use Topics  . Smoking status: Never Smoker  . Smokeless tobacco: Never Used  . Alcohol use No   Additionally: Denies depressed mood, alcohol use, smoking but used to chewable tobacco when she was young. Goes to gym frequently. She lives with her daughter and grandchildren  Review of Systems  Constitutional: Negative for diaphoresis, fever, malaise/fatigue and weight loss.  HENT: Negative for sore throat.   Respiratory: Positive for cough. Negative for hemoptysis and shortness of breath.   Cardiovascular: Negative for chest pain, palpitations and leg swelling.  Gastrointestinal: Positive for heartburn. Negative for abdominal pain, blood  in stool and melena.       Improved since she started PPI  Genitourinary: Negative for dysuria.       Denies vaginal discharge/vaginal bleeding  Neurological: Negative for dizziness.  Psychiatric/Behavioral: Negative for depression.     Screening Colon Cancer Screening: She states she had colonoscopy about 4 days ago Breast Cancer Screening: Patient to schedule Cervical cancer Screening: Patient to return to have a Pap smear.  Sleep Apnea Screening: Ordered Opthalmology Visit: No acute vision changes.. Wears eyeglasses.  Dental Visit: Patient has a dental home HCV Screening: Done and negative  Immunizations -Needs pneumonia and Zostavax  Review of Systems  Constitutional: Negative for diaphoresis, fever, malaise/fatigue and weight loss.  HENT: Negative for sore throat.   Respiratory: Positive for cough. Negative for hemoptysis and shortness of breath.   Cardiovascular: Negative for chest pain, palpitations and leg swelling.  Gastrointestinal: Positive for heartburn. Negative for abdominal pain, blood in stool and melena.       Improved since she started PPI  Genitourinary: Negative for dysuria.       Denies vaginal discharge/vaginal bleeding  Neurological: Negative for dizziness.  Psychiatric/Behavioral: Negative for depression.        Objective:   Physical Exam Vitals:   09/11/16 1102 09/11/16 1226  BP: (!) 138/98 (!) 130/100  Pulse: 72   Temp: 97.5 F (36.4 C)   TempSrc: Oral   SpO2: 96%   Weight: 139 lb 9.6 oz (63.3 kg)   Height: 5' (1.524 m)    Body mass index is 27.26 kg/m.  GEN: appears well,  no apparent distress, in a good mood. Head: normocephalic and atraumatic  Ears: external ear and ear  canal normal. Perforated TM in left ear. No erythema apparent discharge.  Oropharynx: mmm without erythema or exudation HEM: negative for cervical or periauricular lymphadenopathies CVS: RRR, nl s1 & s2, no murmurs, no edema,  2+ DP & PT pulses bilaterally RESP: no  IWOB, good air movement bilaterally, CTAB GI: BS present & normal, soft, NTND MSK: no focal tenderness or notable swelling SKIN: no apparent skin lesion NEURO: alert and oiented appropriately, no gross defecits  PSYCH: euthymic mood with congruent affect    Assessment & Plan:  Routine adult health maintenance Patient reports going to gym and exercising. She also swims. Encouraged her to keep up. Daughter says she had colonoscopy about 4 days ago. Will follow-up on results. Ordered DEXA scan today. Patient to schedule this and mammogram.  Patient to return in 2 weeks for Pap smear. Not sure if she ever had Pap smear Needs pneumonia vaccine & Zostavax. We'll address this at next visit. Recommend follow-up with the eye doctor and dentist regularly.  Cough Patient with chronic cough. Cough improved. Per patient, completed TB treatment course about 10 years ago. She has no constitutional symptoms. Doubt this is related to ARB's as patient had cough prior to starting this.  -Ordered chest x-ray today  Essential hypertension BP elevated to 130/100 on repeat. Reports good compliance with amlodipine and Diovan/HCTZ. Patient was referred for sleep study. Neurology is arranging this.  -Increased her Diovan to 320 mg -BMP today and follow-up in 2 weeks. We will check blood pressure and repeat BMP.     Perforated eardrum, left No signs and symptoms of infection. No apparent discharge

## 2016-09-11 NOTE — Assessment & Plan Note (Signed)
Patient with chronic cough. Cough improved. Per patient, completed TB treatment course about 10 years ago. She has no constitutional symptoms. Doubt this is related to ARB's as patient had cough prior to starting this.  -Ordered chest x-ray today

## 2016-09-12 ENCOUNTER — Other Ambulatory Visit: Payer: Self-pay | Admitting: Student

## 2016-09-12 ENCOUNTER — Encounter: Payer: Self-pay | Admitting: Student

## 2016-09-12 DIAGNOSIS — Z1231 Encounter for screening mammogram for malignant neoplasm of breast: Secondary | ICD-10-CM

## 2016-09-12 LAB — CBC WITH DIFFERENTIAL/PLATELET
Basophils Absolute: 0 10*3/uL (ref 0.0–0.2)
Basos: 0 %
EOS (ABSOLUTE): 0.1 10*3/uL (ref 0.0–0.4)
Eos: 2 %
Hematocrit: 39.4 % (ref 34.0–46.6)
Hemoglobin: 12.3 g/dL (ref 11.1–15.9)
Immature Grans (Abs): 0 10*3/uL (ref 0.0–0.1)
Immature Granulocytes: 0 %
Lymphocytes Absolute: 1.8 10*3/uL (ref 0.7–3.1)
Lymphs: 27 %
MCH: 22.5 pg — ABNORMAL LOW (ref 26.6–33.0)
MCHC: 31.2 g/dL — ABNORMAL LOW (ref 31.5–35.7)
MCV: 72 fL — ABNORMAL LOW (ref 79–97)
Monocytes Absolute: 0.4 10*3/uL (ref 0.1–0.9)
Monocytes: 6 %
Neutrophils Absolute: 4.5 10*3/uL (ref 1.4–7.0)
Neutrophils: 65 %
Platelets: 290 10*3/uL (ref 150–379)
RBC: 5.46 x10E6/uL — ABNORMAL HIGH (ref 3.77–5.28)
RDW: 16.5 % — ABNORMAL HIGH (ref 12.3–15.4)
WBC: 6.8 10*3/uL (ref 3.4–10.8)

## 2016-09-12 LAB — BASIC METABOLIC PANEL
BUN/Creatinine Ratio: 23 (ref 12–28)
BUN: 19 mg/dL (ref 8–27)
CO2: 26 mmol/L (ref 18–29)
Calcium: 10.3 mg/dL (ref 8.7–10.3)
Chloride: 99 mmol/L (ref 96–106)
Creatinine, Ser: 0.84 mg/dL (ref 0.57–1.00)
GFR calc Af Amer: 82 mL/min/{1.73_m2} (ref >59)
GFR calc non Af Amer: 71 mL/min/{1.73_m2} (ref >59)
Glucose: 92 mg/dL (ref 65–99)
Potassium: 3.6 mmol/L (ref 3.5–5.2)
Sodium: 140 mmol/L (ref 134–144)

## 2016-09-15 ENCOUNTER — Other Ambulatory Visit: Payer: Self-pay | Admitting: *Deleted

## 2016-09-15 DIAGNOSIS — I10 Essential (primary) hypertension: Secondary | ICD-10-CM

## 2016-09-15 MED ORDER — AMLODIPINE BESYLATE 10 MG PO TABS: 10.0000 mg | ORAL_TABLET | Freq: Every day | ORAL | 3 refills | Status: DC

## 2016-09-15 MED ORDER — AMLODIPINE BESYLATE 10 MG PO TABS: 10.0000 mg | ORAL_TABLET | Freq: Every day | ORAL | 0 refills | Status: DC

## 2016-09-15 NOTE — Addendum Note (Signed)
Addended by: Wendee Beavers on: 09/15/2016 02:49 PM   Modules accepted: Orders

## 2016-09-18 DIAGNOSIS — Z1211 Encounter for screening for malignant neoplasm of colon: Secondary | ICD-10-CM | POA: Diagnosis not present

## 2016-09-18 DIAGNOSIS — K5730 Diverticulosis of large intestine without perforation or abscess without bleeding: Secondary | ICD-10-CM | POA: Diagnosis not present

## 2016-09-18 DIAGNOSIS — D126 Benign neoplasm of colon, unspecified: Secondary | ICD-10-CM | POA: Diagnosis not present

## 2016-09-21 DIAGNOSIS — D126 Benign neoplasm of colon, unspecified: Secondary | ICD-10-CM | POA: Diagnosis not present

## 2016-09-21 DIAGNOSIS — Z1211 Encounter for screening for malignant neoplasm of colon: Secondary | ICD-10-CM | POA: Diagnosis not present

## 2016-10-02 ENCOUNTER — Other Ambulatory Visit: Payer: Self-pay | Admitting: Student

## 2016-10-02 ENCOUNTER — Ambulatory Visit: Payer: Medicare Other | Admitting: Student

## 2016-10-02 ENCOUNTER — Other Ambulatory Visit: Payer: Self-pay | Admitting: Internal Medicine

## 2016-10-02 DIAGNOSIS — K219 Gastro-esophageal reflux disease without esophagitis: Secondary | ICD-10-CM

## 2016-10-02 DIAGNOSIS — I10 Essential (primary) hypertension: Secondary | ICD-10-CM

## 2016-10-02 DIAGNOSIS — E7800 Pure hypercholesterolemia, unspecified: Secondary | ICD-10-CM

## 2016-10-02 MED ORDER — VALSARTAN-HYDROCHLOROTHIAZIDE 320-25 MG PO TABS: 1.0000 | ORAL_TABLET | Freq: Every day | ORAL | 0 refills | Status: DC

## 2016-10-09 ENCOUNTER — Encounter: Payer: Self-pay | Admitting: Student

## 2016-10-09 ENCOUNTER — Other Ambulatory Visit (HOSPITAL_COMMUNITY)
Admission: RE | Admit: 2016-10-09 | Discharge: 2016-10-09 | Disposition: A | Discharge status: Home / Self Care | Payer: Medicare Other | Source: Ambulatory Visit | Attending: Family Medicine | Admitting: Family Medicine

## 2016-10-09 ENCOUNTER — Ambulatory Visit (INDEPENDENT_AMBULATORY_CARE_PROVIDER_SITE_OTHER): Payer: Medicare Other | Admitting: Student

## 2016-10-09 VITALS — BP 125/80 | HR 67 | Temp 97.9°F | Ht 60.0 in | Wt 138.2 lb

## 2016-10-09 DIAGNOSIS — Z23 Encounter for immunization: Secondary | ICD-10-CM

## 2016-10-09 DIAGNOSIS — Z0000 Encounter for general adult medical examination without abnormal findings: Secondary | ICD-10-CM | POA: Diagnosis not present

## 2016-10-09 DIAGNOSIS — Z124 Encounter for screening for malignant neoplasm of cervix: Secondary | ICD-10-CM | POA: Insufficient documentation

## 2016-10-09 DIAGNOSIS — I10 Essential (primary) hypertension: Secondary | ICD-10-CM

## 2016-10-09 MED ORDER — ASPIRIN 81 MG PO CHEW: 81.0000 mg | CHEWABLE_TABLET | Freq: Once | ORAL | 11 refills | Status: AC

## 2016-10-09 NOTE — Assessment & Plan Note (Signed)
Well controlled. Blood pressure 125/80 today. Good compliance with her medication. Exercising daily. -Continue amlodipine and valsartan/HCTZ -BMP today. -Follow up in 6 months or sooner if needed

## 2016-10-09 NOTE — Progress Notes (Signed)
  Subjective:    Mackenzie Taylor is a 71 y.o. old female here for pap smear and follow up on blood pressure.  In person interpreter from [place], [place] was used for this encounter.   HPI Hypertension: brought her medication bottles to this visit. She is on amlodipine 10 mg and valsartan/HCTZ 320-25 milligrams daily for hypertension. Reports exercising daily including walking on the treadmill, bike and swimming. She goes to gym. She reports feeling great. Denies dizziness, palpitation, chest pain or trouble breathing.   Cervical cancer screening: She never had a Pap smear. Not sexually active for the last 5 years. Denies vaginal discharge or bleeding PMH/Problem List: has Essential hypertension; Gastroesophageal reflux disease without esophagitis; Pure hypercholesterolemia; Cough; Snoring; Excessive daytime sleepiness; Hypertension goal BP (blood pressure) < 130/80; Perforated eardrum, left; and Routine adult health maintenance on her problem list.   has a past medical history of Hypertension.  FH:  No family history on file.  [place] Social History  Substance Use Topics  . Smoking status: Never Smoker  . Smokeless tobacco: Never Used  . Alcohol use No    Review of Systems Review of systems negative except for pertinent positives and negatives in history of present illness above.     Objective:     Vitals:   10/09/16 0842  BP: 125/80  Pulse: 67  Temp: 97.9 F (36.6 C)  TempSrc: Oral  SpO2: 98%  Weight: 138 lb 3.2 oz (62.7 kg)  Height: 5' (1.524 m)    Physical Exam GEN: appears well, no apparent distress. CVS: RRR, nl S1&S2, no murmurs, no edema, radial and DP pulses 2+ bilaterally RESP: speaks in full sentence, no IWOB, good air movement bilaterally, CTAB GI: BS present & normal, soft, NTND GU:  No suprapubic or CVA tenerness External genitalia: normal without surrounding skin lesion, obvious discharge or bleeeding.  Speculum: pink vaginal mucosa, ruggated, normal cervix &  discharge. Bimanual: no cervical motion tenderness or adnexal mass. Uterus appears normal size  MSK: no focal tenderness or notable swelling SKIN: no apparent skin lesion NEURO: alert and oiented appropriately, no gross defecits  PSYCH: euthymic mood with congruent affect    Assessment and Plan:  Essential hypertension Well controlled. Blood pressure 125/80 today. Good compliance with her medication. Exercising daily. -Continue amlodipine and valsartan/HCTZ -BMP today. -Follow up in 6 months or sooner if needed  Routine adult health maintenance Had colonoscopy but we have not received the results of this. She has an apt for DEXA scan and mammogram tomorrow Pap smear obtained today Received the first of a pneumonia vaccine (PSV-13) series today. Worse if PSSC-23 when she returns in 6 months.   Orders Placed This Encounter  Procedures  . Basic metabolic panel   Meds ordered this encounter  Medications  . aspirin (ASPIRIN 81) 81 MG chewable tablet    Sig: Chew 1 tablet (81 mg total) by mouth once.    Dispense:  1 tablet    Refill:  11   Return in about 6 months (around 04/11/2017) for HTN.  Mercy Riding, MD 10/09/16 Pager: [phone]

## 2016-10-09 NOTE — Assessment & Plan Note (Signed)
Had colonoscopy but we have not received the results of this. She has an apt for DEXA scan and mammogram tomorrow Pap smear obtained today Received the first of a pneumonia vaccine (PSV-13) series today. Worse if PSSC-23 when she returns in 6 months.

## 2016-10-09 NOTE — Addendum Note (Signed)
Addended by: Leonia Corona R on: 10/09/2016 09:36 AM   Modules accepted: Orders

## 2016-10-09 NOTE — Patient Instructions (Signed)
It was great seeing you today! We have addressed the following issues today 1. Blood pressure: Your blood pressure is 125/80. Your goal blood pressure is later 150/90. Keep up the exercise. Continue taking your medication. Risk come back and see [place] in 6 months or sooner if needed 2.  Cervical cancer screening: We have done a Pap smear today. Someone will get in touch with you in the result is abnormal. Otherwise, you'll get a letter in mail.  If we did any lab work today, and the results require attention, either me or my nurse will get in touch with you. If everything is normal, you will get a letter in mail and a message via . If you don't hear from [place] in two weeks, please give [place] a call. Otherwise, we look forward to seeing you again at your next visit. If you have any questions or concerns before then, please call the clinic at [phone].  Please bring all your medications to every doctors visit  Sign up for My Chart to have easy access to your labs results, and communication with your Primary care physician.    Please check-out at the front desk before leaving the clinic.    Take Care,   Dr. Cyndia Skeeters

## 2016-10-10 ENCOUNTER — Ambulatory Visit
Admission: RE | Admit: 2016-10-10 | Discharge: 2016-10-10 | Disposition: A | Discharge status: Home / Self Care | Payer: Medicare Other | Source: Ambulatory Visit | Attending: Family Medicine | Admitting: Family Medicine

## 2016-10-10 ENCOUNTER — Telehealth: Payer: Self-pay | Admitting: Student

## 2016-10-10 ENCOUNTER — Encounter: Payer: Self-pay | Admitting: Student

## 2016-10-10 DIAGNOSIS — M8588 Other specified disorders of bone density and structure, other site: Secondary | ICD-10-CM | POA: Insufficient documentation

## 2016-10-10 DIAGNOSIS — Z1231 Encounter for screening mammogram for malignant neoplasm of breast: Secondary | ICD-10-CM | POA: Diagnosis not present

## 2016-10-10 DIAGNOSIS — E2839 Other primary ovarian failure: Secondary | ICD-10-CM

## 2016-10-10 DIAGNOSIS — M85851 Other specified disorders of bone density and structure, right thigh: Secondary | ICD-10-CM

## 2016-10-10 DIAGNOSIS — Z780 Asymptomatic menopausal state: Secondary | ICD-10-CM | POA: Diagnosis not present

## 2016-10-10 DIAGNOSIS — M8589 Other specified disorders of bone density and structure, multiple sites: Secondary | ICD-10-CM | POA: Diagnosis not present

## 2016-10-10 LAB — BASIC METABOLIC PANEL
BUN / CREAT RATIO: 22 (ref 12–28)
BUN: 20 mg/dL (ref 8–27)
CALCIUM: 10.2 mg/dL (ref 8.7–10.3)
CO2: 20 mmol/L (ref 20–29)
CREATININE: 0.91 mg/dL (ref 0.57–1.00)
Chloride: 98 mmol/L (ref 96–106)
GFR calc Af Amer: 74 mL/min/{1.73_m2} (ref >59)
GFR calc non Af Amer: 65 mL/min/{1.73_m2} (ref >59)
Glucose: 98 mg/dL (ref 65–99)
Potassium: 4.2 mmol/L (ref 3.5–5.2)
Sodium: 139 mmol/L (ref 134–144)

## 2016-10-10 LAB — CYTOLOGY - PAP: Diagnosis: NEGATIVE

## 2016-10-10 MED ORDER — CALCIUM CARBONATE-VITAMIN D3 600-400 MG-UNIT PO TABS: ORAL_TABLET | ORAL | 3 refills | Status: DC

## 2016-10-10 NOTE — Telephone Encounter (Signed)
DEXA scan suggestive for osteopenia for right femoral neck and lumbar spine. Called and discussed this with patient's daughter. Sent Rx for Vit/Cal to her pharmacy. Advised her to take in the evening with meals. She is on PPI. We may stop the PPI soon.

## 2016-10-10 NOTE — Progress Notes (Signed)
Pap smear normal. Sent a letter to the patient

## 2016-11-10 ENCOUNTER — Other Ambulatory Visit: Payer: Self-pay | Admitting: Student

## 2016-11-10 DIAGNOSIS — I10 Essential (primary) hypertension: Secondary | ICD-10-CM

## 2016-11-10 MED ORDER — LOSARTAN POTASSIUM-HCTZ 100-25 MG PO TABS: 1.0000 | ORAL_TABLET | Freq: Every day | ORAL | 1 refills | Status: DC

## 2016-11-10 NOTE — Progress Notes (Signed)
Changed Valsartan-HCTZ to Losartan-HCTZ. Valsartan on recall. Called and talked to patient's daughter about this. Advised patient's daughter to schedule office visit for blood pressure check after a week in to the course of Losartan/HCTZ.

## 2016-12-04 ENCOUNTER — Other Ambulatory Visit: Payer: Self-pay | Admitting: Student

## 2016-12-04 DIAGNOSIS — E7800 Pure hypercholesterolemia, unspecified: Secondary | ICD-10-CM

## 2017-01-30 ENCOUNTER — Other Ambulatory Visit: Payer: Self-pay | Admitting: Student

## 2017-01-30 DIAGNOSIS — E7800 Pure hypercholesterolemia, unspecified: Secondary | ICD-10-CM

## 2017-01-30 DIAGNOSIS — K219 Gastro-esophageal reflux disease without esophagitis: Secondary | ICD-10-CM

## 2017-04-04 ENCOUNTER — Other Ambulatory Visit: Payer: Self-pay | Admitting: Student

## 2017-04-04 DIAGNOSIS — E7800 Pure hypercholesterolemia, unspecified: Secondary | ICD-10-CM

## 2017-05-07 ENCOUNTER — Other Ambulatory Visit: Payer: Self-pay | Admitting: Student

## 2017-05-07 DIAGNOSIS — I10 Essential (primary) hypertension: Secondary | ICD-10-CM

## 2017-06-04 ENCOUNTER — Other Ambulatory Visit: Payer: Self-pay | Admitting: Student

## 2017-06-04 DIAGNOSIS — E7800 Pure hypercholesterolemia, unspecified: Secondary | ICD-10-CM

## 2017-06-04 DIAGNOSIS — K219 Gastro-esophageal reflux disease without esophagitis: Secondary | ICD-10-CM

## 2017-06-04 MED ORDER — OMEPRAZOLE 20 MG PO CPDR: 20.0000 mg | DELAYED_RELEASE_CAPSULE | Freq: Every day | ORAL | 1 refills | Status: DC

## 2017-06-05 ENCOUNTER — Other Ambulatory Visit: Payer: Self-pay | Admitting: Student

## 2017-06-06 ENCOUNTER — Telehealth: Payer: Self-pay | Admitting: Student

## 2017-06-06 NOTE — Telephone Encounter (Signed)
Attempted to call patient on her daughter's phone in regards to a refill request on pantoprazole 40 mg.  Unfortunately, she did not pick up the phone.  She has been on pantoprazole 40 mg for long time. Long term using of high dose PPI has some adverse effects including risk of osteoporosis, Clostridium difficile, pneumonia and kidney injury. I am changing to Omeprazole 20 mg daily.  Please advise her about this change if she calls back.  She can also schedule an office visit if she wants to discuss about this in person. Thank you, Bretta Bang

## 2017-09-17 ENCOUNTER — Other Ambulatory Visit: Payer: Self-pay | Admitting: Student

## 2017-09-17 DIAGNOSIS — I10 Essential (primary) hypertension: Secondary | ICD-10-CM

## 2017-11-01 ENCOUNTER — Other Ambulatory Visit: Payer: Self-pay

## 2017-11-01 DIAGNOSIS — I10 Essential (primary) hypertension: Secondary | ICD-10-CM

## 2017-11-02 MED ORDER — LOSARTAN POTASSIUM-HCTZ 100-25 MG PO TABS: 1.0000 | ORAL_TABLET | Freq: Every day | ORAL | 1 refills | Status: DC

## 2017-11-21 IMAGING — DX DG FOREARM 2V*R*
2 series · 2 of 2 positions shown · non-contrast
Comparison: None.

CLINICAL DATA: Pain after fall.  Posterolateral for bruising.

EXAM:
RIGHT FOREARM - 2 VIEW

[forearm ap]
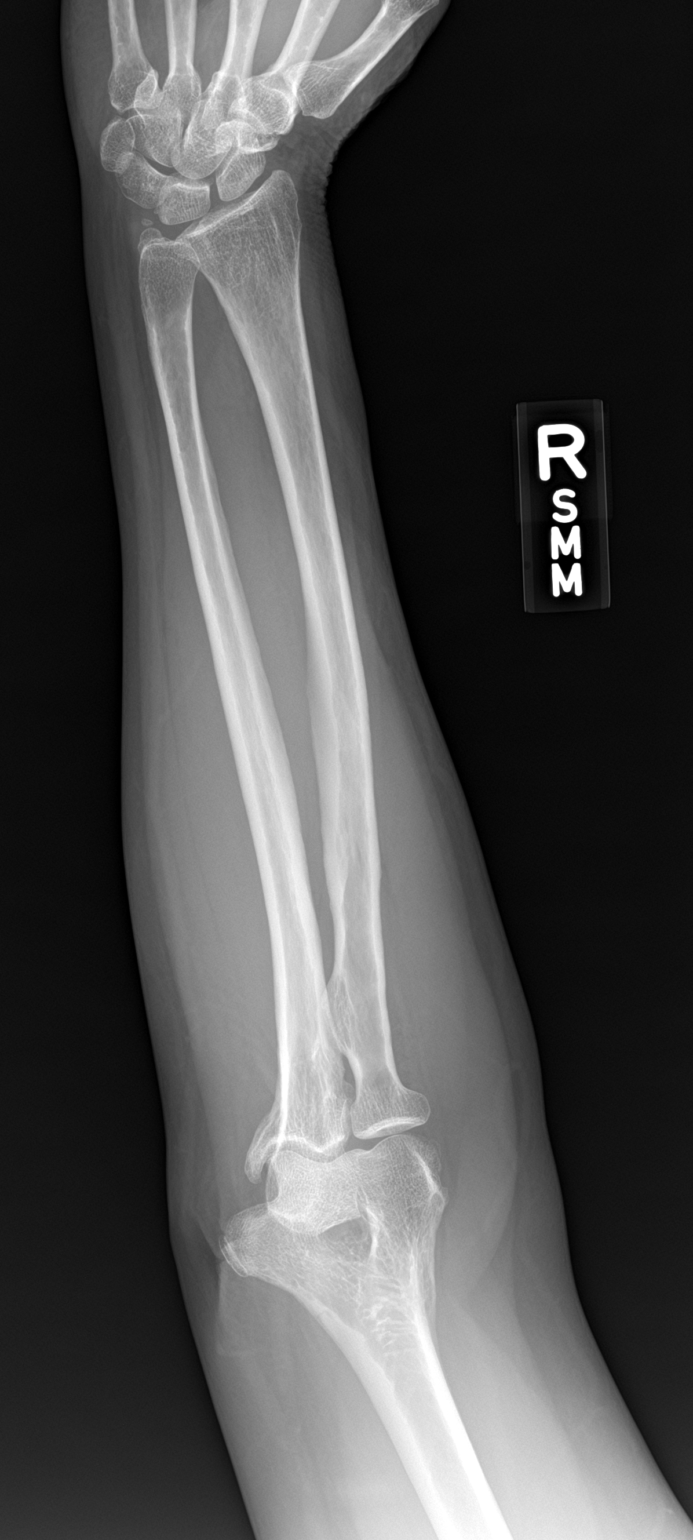

[forearm lat]
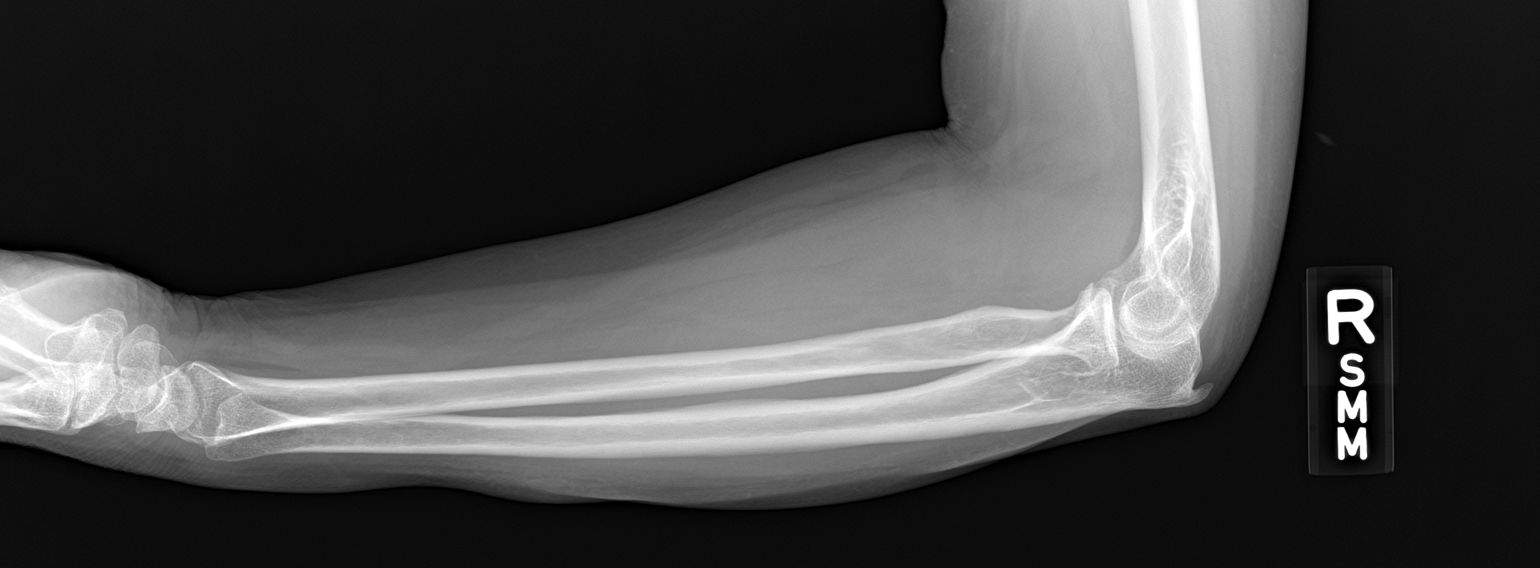

[2 of 2 positions shown; findings below may reference images not displayed]

FINDINGS: There soft tissue swelling along the dorsum of the forearm. Proximal
ulnar spurring is seen posteriorly and along its ulnar aspect off
the coronoid. Small accessory ossicle is noted off the tip of the
ulnar styloid. No acute fracture nor bone destruction. No
malalignment at the elbow joint nor joint effusion. The osseous
elements of the carpal bones are nonacute in appearance.
IMPRESSION: Soft tissue swelling without acute osseous abnormality of the right
forearm. Proximal ulnar spurring.

## 2017-12-04 ENCOUNTER — Other Ambulatory Visit: Payer: Self-pay

## 2017-12-04 DIAGNOSIS — K219 Gastro-esophageal reflux disease without esophagitis: Secondary | ICD-10-CM

## 2017-12-04 MED ORDER — OMEPRAZOLE 20 MG PO CPDR: 20.0000 mg | DELAYED_RELEASE_CAPSULE | Freq: Every day | ORAL | 1 refills | Status: DC

## 2017-12-05 ENCOUNTER — Other Ambulatory Visit: Payer: Self-pay

## 2017-12-05 DIAGNOSIS — E7800 Pure hypercholesterolemia, unspecified: Secondary | ICD-10-CM

## 2017-12-06 MED ORDER — ATORVASTATIN CALCIUM 40 MG PO TABS: 40.0000 mg | ORAL_TABLET | Freq: Every day | ORAL | 1 refills | Status: DC

## 2018-06-09 ENCOUNTER — Other Ambulatory Visit: Payer: Self-pay | Admitting: Student in an Organized Health Care Education/Training Program

## 2018-06-09 DIAGNOSIS — E7800 Pure hypercholesterolemia, unspecified: Secondary | ICD-10-CM

## 2018-06-09 DIAGNOSIS — K219 Gastro-esophageal reflux disease without esophagitis: Secondary | ICD-10-CM

## 2018-10-15 ENCOUNTER — Telehealth: Payer: Self-pay | Admitting: *Deleted

## 2018-10-15 DIAGNOSIS — I10 Essential (primary) hypertension: Secondary | ICD-10-CM

## 2018-10-16 MED ORDER — AMLODIPINE BESYLATE 10 MG PO TABS: 10.0000 mg | ORAL_TABLET | Freq: Every day | ORAL | 0 refills | Status: DC

## 2018-10-16 NOTE — Telephone Encounter (Signed)
Patient has not been seen in 2 years, please schedule an appointment with me within 1 month.

## 2018-10-28 NOTE — Telephone Encounter (Signed)
Pt daughter informed and scheduled for an appt. Glennette Galster Kennon Holter, CMA

## 2018-10-28 NOTE — Telephone Encounter (Signed)
To red team to call. Christen Bame, CMA

## 2018-11-04 ENCOUNTER — Telehealth: Payer: Self-pay

## 2018-11-04 ENCOUNTER — Ambulatory Visit (INDEPENDENT_AMBULATORY_CARE_PROVIDER_SITE_OTHER): Payer: Medicare Other | Admitting: Family Medicine

## 2018-11-04 ENCOUNTER — Other Ambulatory Visit: Payer: Self-pay

## 2018-11-04 ENCOUNTER — Encounter: Payer: Self-pay | Admitting: Family Medicine

## 2018-11-04 VITALS — BP 125/75 | HR 75

## 2018-11-04 DIAGNOSIS — G8929 Other chronic pain: Secondary | ICD-10-CM | POA: Diagnosis not present

## 2018-11-04 DIAGNOSIS — M85859 Other specified disorders of bone density and structure, unspecified thigh: Secondary | ICD-10-CM | POA: Diagnosis not present

## 2018-11-04 DIAGNOSIS — I10 Essential (primary) hypertension: Secondary | ICD-10-CM | POA: Diagnosis present

## 2018-11-04 DIAGNOSIS — M5442 Lumbago with sciatica, left side: Secondary | ICD-10-CM | POA: Diagnosis not present

## 2018-11-04 DIAGNOSIS — Z1231 Encounter for screening mammogram for malignant neoplasm of breast: Secondary | ICD-10-CM | POA: Diagnosis not present

## 2018-11-04 DIAGNOSIS — K219 Gastro-esophageal reflux disease without esophagitis: Secondary | ICD-10-CM | POA: Diagnosis not present

## 2018-11-04 DIAGNOSIS — Z23 Encounter for immunization: Secondary | ICD-10-CM | POA: Diagnosis present

## 2018-11-04 DIAGNOSIS — E559 Vitamin D deficiency, unspecified: Secondary | ICD-10-CM | POA: Diagnosis not present

## 2018-11-04 NOTE — Assessment & Plan Note (Signed)
Discontinue PPI. Consider H2 blocker- patient wants to try dietary changes.

## 2018-11-04 NOTE — Patient Instructions (Signed)
It was wonderful to see you today.  Thank you for choosing [facility].   Please call [phone] with any questions about today's appointment.  Please be sure to schedule follow up at the front  desk before you leave today.   Carina Brown, MD  Family Medicine    

## 2018-11-04 NOTE — Telephone Encounter (Signed)
Called patient's daughter Mackenzie Taylor and informed her of appointment for mammogram 12/18/2018 @ 1350. [facility] [address], [place]  Patient can walk into GI  [facility], [address] and get her xray at anytime.  Patients daughter indorses understanding.  Ozella Almond, [place]

## 2018-11-04 NOTE — Progress Notes (Signed)
  Subjective:  Patient ID: Ardine Iacovelli  DOB: 1946-03-07 MRN: [id number]  Qunisha [place] is a 73 y.o. female with a PMH of osteopenia, hypertension, and GERD, here today for low back pain and health maintenance.   HPI:  Low Back Pain: - She reports long-standing, episodic left-sided low back pain with radiculopathy extending down her L leg. Though she has had symptoms for a number of years, she reports that it has been worse in recent months, which she attributes to her gym being closed. She reports that the gym sauna and swimming help her with pain control. At home she uses a heating pad and self-massage for pain control.  She denies any urinary retention or incontinence, changes in stool, or saddle anesthesia. She reports that the pain is worse at night, but does not wake her from sleep.   GERD: -Well-controlled. She reports that she only has symptoms when she eats spicy foods and expressed a willingness to reduce intake of spicy foods for non-pharmacologic control.   Osteopenia: -She has not been taking her Calcium/Vit-D supplement, but does report high levels of dietary calcium intake, mostly in the form of leafy greens and yogurt.   Health Maintenance:  -She reports having had a colonoscopy in 2017, but cannot remember the name of the practice. Reports that it was in the same building as the [facility].  -She is willing to have the PNA-23 vaccine in clinic today. -She is due for a mammogram and expresses a willingness to have this done.     Objective:  BP 125/75   Pulse 75   SpO2 98%   Vitals and nursing note reviewed  General: NAD, pleasant Cardiac: RRR, normal heart sounds, no m/r/g Pulm: normal effort, CTAB Back: Posture is slightly kyphotic. Gait is asymmetric, favoring the right side. Full ROM of the lumbar spine. Strength and sensation intact in lower extremities BL. Tenderness noted over the left SI joint.  Assessment & Plan:   Low Back Pain: - Differential  includes SI joint dysfunction, herniated disk, and osteoporotic fracture. Given her history of osteopenia and her recent lack of supplementation, I believe a plain film is indicated. Will order X-ray. Encouraged her to continue exercise as tolerated.   GERD: - Given her history of osteopenia and relatively mild symptoms, recommend stopping omeprazole. She assents to this plan. Omeprazole disposed of in clinic.   Osteopenia: - Given her dietary intake, will check vit-D levels before restarting supplementation. Given results of her last DEXA, she is not due for another until 2022.   Health Maintenance:  -Ordered and scheduled mammogram.  -Given PNA-23 in clinic. -Will attempt to locate Colonoscopy records.   Marnee Guarneri Medical Student

## 2018-11-04 NOTE — Assessment & Plan Note (Signed)
Continue on amlodipine. BMET today.

## 2018-11-05 ENCOUNTER — Encounter: Payer: Self-pay | Admitting: Family Medicine

## 2018-11-05 LAB — BASIC METABOLIC PANEL
BUN/Creatinine Ratio: 21 (ref 12–28)
BUN: 16 mg/dL (ref 8–27)
CO2: 21 mmol/L (ref 20–29)
Calcium: 10.4 mg/dL — ABNORMAL HIGH (ref 8.7–10.3)
Chloride: 104 mmol/L (ref 96–106)
Creatinine, Ser: 0.76 mg/dL (ref 0.57–1.00)
GFR calc Af Amer: 91 mL/min/{1.73_m2} (ref >59)
GFR calc non Af Amer: 79 mL/min/{1.73_m2} (ref >59)
Glucose: 101 mg/dL — ABNORMAL HIGH (ref 65–99)
Potassium: 3.7 mmol/L (ref 3.5–5.2)
Sodium: 142 mmol/L (ref 134–144)

## 2018-11-05 LAB — VITAMIN D 25 HYDROXY (VIT D DEFICIENCY, FRACTURES): Vit D, 25-Hydroxy: 32.9 ng/mL (ref 30.0–100.0)

## 2018-12-03 ENCOUNTER — Other Ambulatory Visit: Payer: Self-pay | Admitting: Student in an Organized Health Care Education/Training Program

## 2018-12-03 DIAGNOSIS — E7800 Pure hypercholesterolemia, unspecified: Secondary | ICD-10-CM

## 2018-12-04 ENCOUNTER — Other Ambulatory Visit: Payer: Self-pay | Admitting: Student in an Organized Health Care Education/Training Program

## 2018-12-04 DIAGNOSIS — K219 Gastro-esophageal reflux disease without esophagitis: Secondary | ICD-10-CM

## 2018-12-12 ENCOUNTER — Other Ambulatory Visit: Payer: Self-pay | Admitting: Family Medicine

## 2018-12-12 DIAGNOSIS — I10 Essential (primary) hypertension: Secondary | ICD-10-CM

## 2018-12-18 ENCOUNTER — Ambulatory Visit: Payer: Medicare Other

## 2019-01-29 ENCOUNTER — Ambulatory Visit
Admission: RE | Admit: 2019-01-29 | Discharge: 2019-01-29 | Disposition: A | Discharge status: Home / Self Care | Payer: Medicare Other | Source: Ambulatory Visit | Attending: Family Medicine | Admitting: Family Medicine

## 2019-01-29 ENCOUNTER — Other Ambulatory Visit: Payer: Self-pay

## 2019-01-29 ENCOUNTER — Encounter: Payer: Self-pay | Admitting: Family Medicine

## 2019-01-29 DIAGNOSIS — Z1231 Encounter for screening mammogram for malignant neoplasm of breast: Secondary | ICD-10-CM

## 2019-06-04 ENCOUNTER — Other Ambulatory Visit: Payer: Self-pay | Admitting: Family Medicine

## 2019-06-04 DIAGNOSIS — E7800 Pure hypercholesterolemia, unspecified: Secondary | ICD-10-CM

## 2019-06-13 ENCOUNTER — Other Ambulatory Visit: Payer: Self-pay | Admitting: Family Medicine

## 2019-06-13 DIAGNOSIS — I10 Essential (primary) hypertension: Secondary | ICD-10-CM

## 2019-12-14 ENCOUNTER — Other Ambulatory Visit: Payer: Self-pay | Admitting: Family Medicine

## 2019-12-14 DIAGNOSIS — I10 Essential (primary) hypertension: Secondary | ICD-10-CM

## 2020-06-10 ENCOUNTER — Other Ambulatory Visit: Payer: Self-pay | Admitting: Family Medicine

## 2020-06-10 DIAGNOSIS — I10 Essential (primary) hypertension: Secondary | ICD-10-CM

## 2020-08-21 IMAGING — MG DIGITAL SCREENING BILAT W/ CAD
4 series · 4 of 4 positions shown · non-contrast
Comparison: Previous exam(s).

CLINICAL DATA: Screening.

EXAM:
DIGITAL SCREENING BILATERAL MAMMOGRAM WITH CAD

[R MLO]
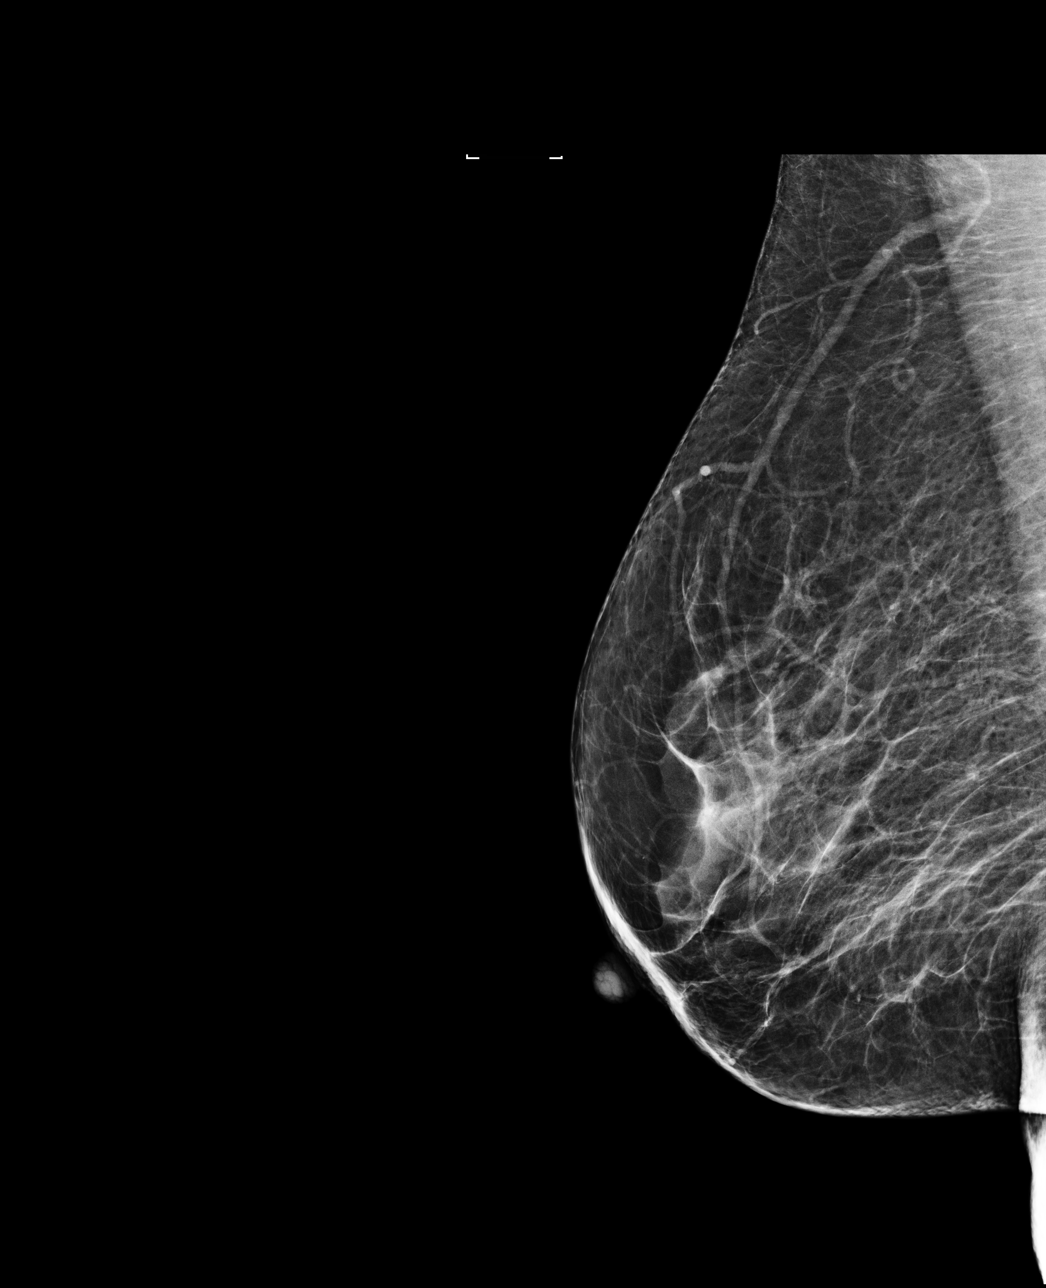

[R CC]
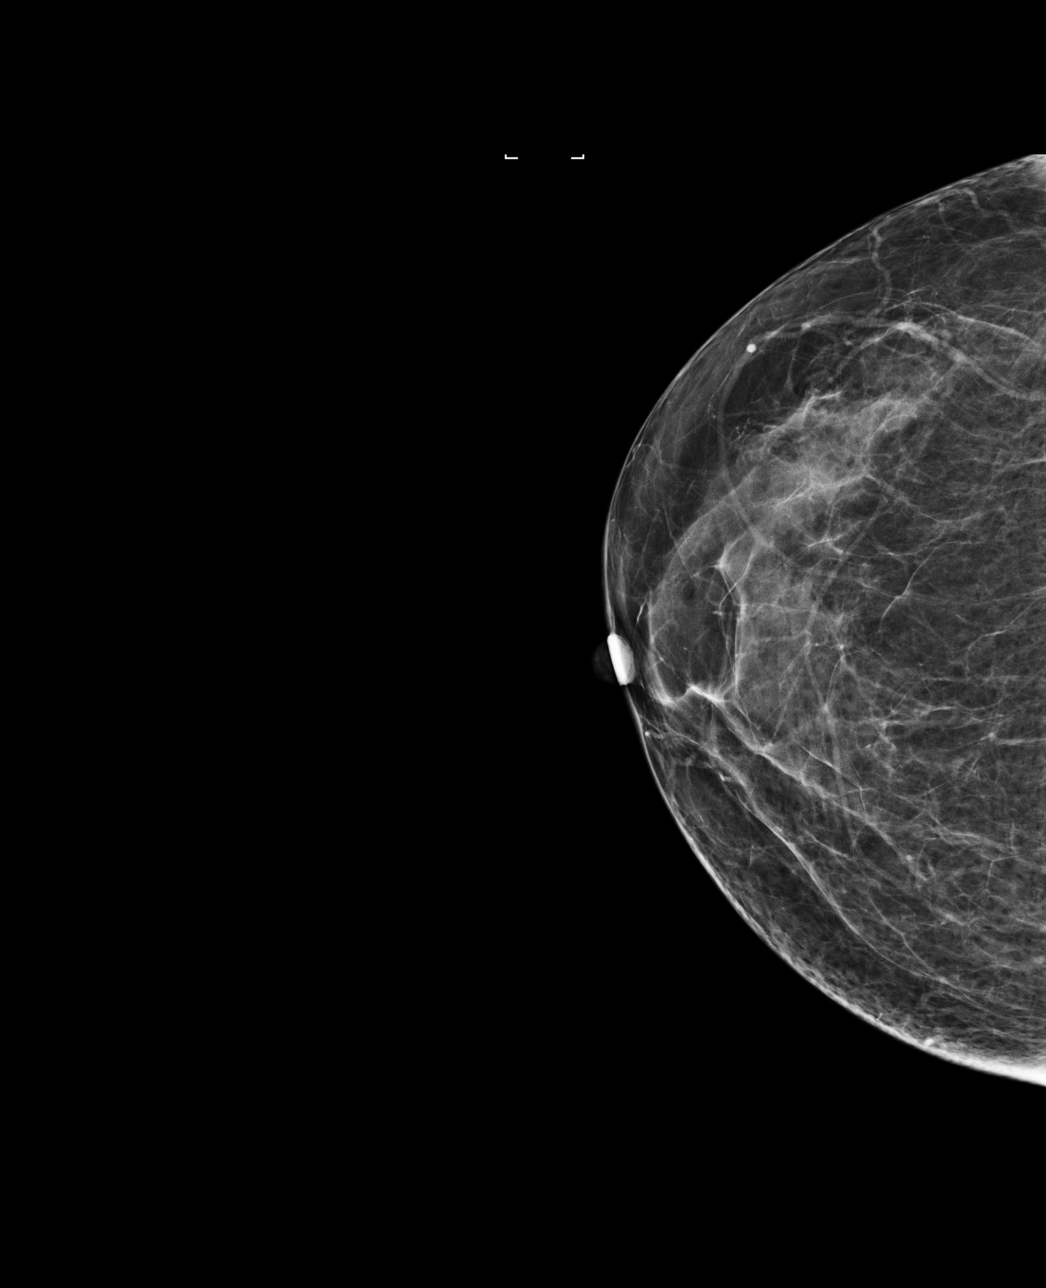

[L MLO]
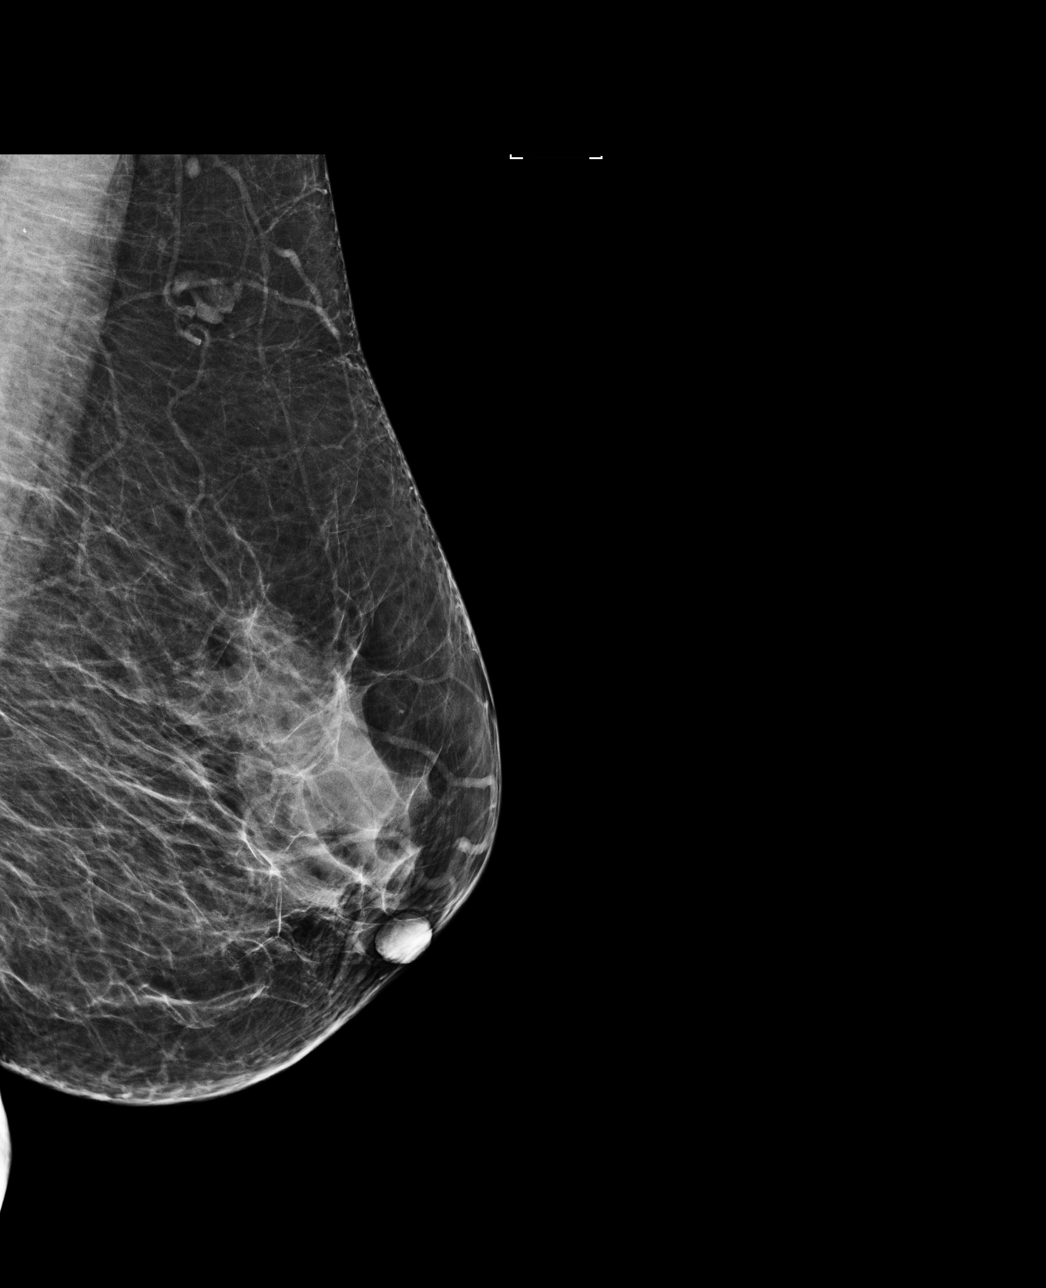

[L CC]
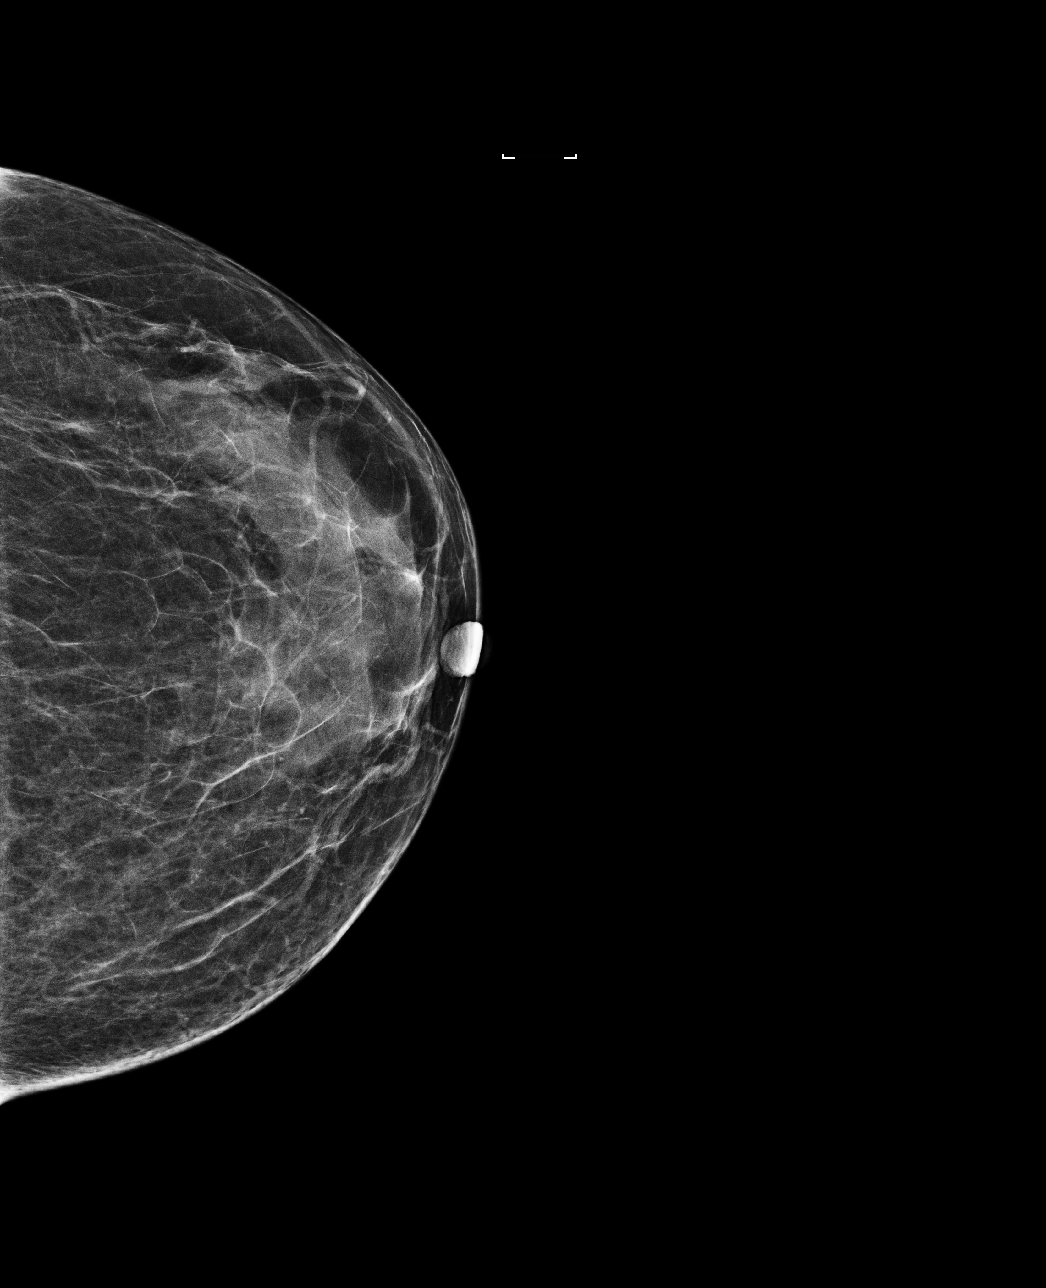

[4 of 4 positions shown; findings below may reference images not displayed]

ACR Breast Density Category c: The breast tissue is heterogeneously
dense, which may obscure small masses.
FINDINGS: There are no findings suspicious for malignancy. Images were
processed with CAD.
IMPRESSION: No mammographic evidence of malignancy. A result letter of this
screening mammogram will be mailed directly to the patient.

RECOMMENDATION:
Screening mammogram in one year. (Code:[id number])

BI-RADS CATEGORY  1: Negative.

## 2020-12-15 ENCOUNTER — Other Ambulatory Visit: Payer: Self-pay | Admitting: Family Medicine

## 2020-12-15 DIAGNOSIS — I10 Essential (primary) hypertension: Secondary | ICD-10-CM

## 2021-01-15 ENCOUNTER — Other Ambulatory Visit: Payer: Self-pay | Admitting: Family Medicine

## 2021-01-15 DIAGNOSIS — I10 Essential (primary) hypertension: Secondary | ICD-10-CM

## 2021-02-09 ENCOUNTER — Other Ambulatory Visit: Payer: Self-pay | Admitting: Family Medicine

## 2021-02-09 DIAGNOSIS — I10 Essential (primary) hypertension: Secondary | ICD-10-CM

## 2021-02-16 NOTE — Telephone Encounter (Signed)
Attempted to call patient through [employer] [phone]  Interpreter is Emogene Morgan at  [phone].  There was no answer and no ability to leave a voice mail as the mailbox was full.  Will try again later.  Ozella Almond, [place]

## 2021-05-03 ENCOUNTER — Other Ambulatory Visit: Payer: Self-pay

## 2021-05-03 ENCOUNTER — Ambulatory Visit (INDEPENDENT_AMBULATORY_CARE_PROVIDER_SITE_OTHER): Payer: Medicare Other | Admitting: Student

## 2021-05-03 VITALS — BP 149/99 | HR 72 | Ht 60.0 in | Wt 135.0 lb

## 2021-05-03 DIAGNOSIS — G8929 Other chronic pain: Secondary | ICD-10-CM

## 2021-05-03 DIAGNOSIS — M5440 Lumbago with sciatica, unspecified side: Secondary | ICD-10-CM | POA: Diagnosis not present

## 2021-05-03 DIAGNOSIS — I10 Essential (primary) hypertension: Secondary | ICD-10-CM | POA: Diagnosis not present

## 2021-05-03 MED ORDER — AMLODIPINE BESYLATE 10 MG PO TABS: ORAL_TABLET | ORAL | 0 refills | Status: DC

## 2021-05-03 NOTE — Progress Notes (Signed)
° ° °  SUBJECTIVE:   CHIEF COMPLAINT / HPI:   HTN BP 184/106 in office today. Doesn't check BP regularly at home. Currently takes amplodipine 10 mg daily but does not take consistently. She denies headaches. She sometimes feels a little dizzy when lifting her head which she thinks is realated to her high BP. No lightheadedness when going from sitting to standing. She denies chest pain, SOB, nausea.   Low back pain  Lower back hurts bilaterally with radiculopathy down left leg. This is chronic. No saddle anesthesia or incontinence. She has never tried physical therapy. She takes tylenol 500mg  every day. Used to go to the gym for swimming but stopped d/t COVID. Now she exercises at home for about 10 minutes every morning.  HDL Pt's daughter states she is no longer taking atorvastatin and is unsure why. They do not recall any side effects from the medication. Last lipid panel done in 2018 with LDL of 135.   PERTINENT  PMH / PSH: HTN, osteopenia of lumbar spine  OBJECTIVE:   Today's Vitals   05/03/21 0932 05/03/21 1342  BP: (!) 184/106 (!) 149/99  Pulse: 72   SpO2: 100%   Weight: 135 lb (61.2 kg)   Height: 5' (1.524 m)   PainSc: 0-No pain    Body mass index is 26.37 kg/m.   General: NAD, pleasant, able to participate in exam Cardiac: RRR, no murmurs. Respiratory: CTAB, normal effort, No wheezes, rales or rhonchi Neuro: alert, CN II-XII intact, sensation intact, finger-nose-finger and alternating hand movements normal.  Psych: Normal affect and mood  ASSESSMENT/PLAN:   HTN Initial BP in office of 184/106 with repeat of 149/99. Neuro exam is reassuring and pt is currently asymptomatic.  -return in 2 weeks for blood pressure check -amlodipine 10 mg refilled   Chronic Back pain  Advised pt she can increase tylenol to 500 mg q6h and can take up to 1000mg  is pain is severe.  -PT referral placed -return in 2 weeks to discuss pain management further if appropriate  HLD Will not  restart medication today. I will read through previous notes to see when and possibly why pt stopped taking Atorvastatin. Will further discuss this at next visit in 2 weeks if appropriate.    In person interpreter used during apt.  Dr. Precious Gilding, [place]

## 2021-05-03 NOTE — Patient Instructions (Signed)
It was great to see you! Thank you for allowing me to participate in your care!  I recommend that you always bring your medications to each appointment as this makes it easy to ensure you are on the correct medications and helps [place] not miss when refills are needed.  Our plans for today:  - I have placed a referral for physical therapy. They will call you to schedule an appointment - I have placed an order for a lumbar xray. You can go at any time to have this done at [place] may increase tylenol for pain to 500 mg every 6 hours. If pain is severe you can take up to 1000 mg when needed.    Take care and seek immediate care sooner if you develop any concerns.   Dr. Precious Gilding, DO [facility]

## 2021-05-03 NOTE — Assessment & Plan Note (Signed)
Initial BP in office of 184/106 with repeat of 149/99. Neuro exam is reassuring and pt is currently asymptomatic.  -return in 2 weeks for blood pressure check -amlodipine 10 mg refilled

## 2021-05-03 NOTE — Assessment & Plan Note (Signed)
Advised pt she can increase tylenol to 500 mg q6h and can take up to 1000mg  is pain is severe.  -PT referral placed -return in 2 weeks to discuss pain management further if appropriate

## 2021-05-17 ENCOUNTER — Ambulatory Visit: Payer: Medicare Other | Admitting: Student

## 2021-05-17 NOTE — Progress Notes (Deleted)
° ° ° °  SUBJECTIVE:   CHIEF COMPLAINT / HPI:   Mackenzie Taylor is a 76 y.o. female presents for ***  A *** speaking interpretor was used for this encounter.    Hypertension: Patient's current antihypertensive  medications include: Amlodipine. Compliant with medications and tolerating well without side effects.  Checking BP at home with readings between *** and **.  Denies any SOB, CP, vision changes, LE edema, medication SEs, or symptoms of hypotension.   Most recent creatinine trend:  Lab Results  Component Value Date   CREATININE 0.76 11/04/2018   CREATININE 0.91 10/09/2016   CREATININE 0.84 09/11/2016     Patient {HAS HAS TXM:46803} had a BMP in the past 1 year.        [place] Office Visit from 05/03/2021 in [facility]  PHQ-9 Total Score 0        Health Maintenance Due  Topic   COLONOSCOPY (Pts 45-19yrs Insurance coverage will need to be confirmed)    Zoster Vaccines- Shingrix (1 of 2)   COVID-19 Vaccine (3 - Booster for [employer] series)   INFLUENZA VACCINE    MAMMOGRAM       PERTINENT  PMH / PSH:   OBJECTIVE:   There were no vitals taken for this visit.   General: Alert, no acute distress Cardio: Normal S1 and S2, RRR, no r/m/g Pulm: CTAB, normal work of breathing Abdomen: Bowel sounds normal. Abdomen soft and non-tender.  Extremities: No peripheral edema.  Neuro: Cranial nerves grossly intact   ASSESSMENT/PLAN:   No problem-specific Assessment & Plan notes found for this encounter.    Lattie Haw, MD PGY-3 [place]

## 2021-05-19 ENCOUNTER — Ambulatory Visit: Payer: Medicare Other | Admitting: Family Medicine

## 2021-05-24 ENCOUNTER — Ambulatory Visit: Payer: Medicare Other | Admitting: Student

## 2021-05-24 NOTE — Therapy (Incomplete)
OUTPATIENT PHYSICAL THERAPY THORACOLUMBAR EVALUATION   Patient Name: Mackenzie Taylor MRN: [id number] DOB:[birth year], 76 y.o., female Today's Date: 05/24/2021    Past Medical History:  Diagnosis Date   Hypertension    Past Surgical History:  Procedure Laterality Date   HAND SURGERY     WISDOM TOOTH EXTRACTION     Patient Active Problem List   Diagnosis Date Noted   Chronic low back pain with sciatica 05/03/2021   Osteopenia of lumbar spine 10/10/2016   Perforated eardrum, left 09/11/2016   Routine adult health maintenance 09/11/2016   Excessive daytime sleepiness 08/24/2016   Hypertension goal BP (blood pressure) < 130/80 08/24/2016   Cough 07/29/2016   Snoring 07/29/2016   Pure hypercholesterolemia 06/06/2016   Gastroesophageal reflux disease without esophagitis 06/02/2016   Essential hypertension 11/09/2014    PCP: Eulis Foster, MD  REFERRING PROVIDER: Martyn Malay, MD  REFERRING DIAG: ***  THERAPY DIAG:  No diagnosis found.  ONSET DATE: ***  SUBJECTIVE:                                                                                                                                                                                           SUBJECTIVE STATEMENT: *** PERTINENT HISTORY:  ***  PAIN:  Are you having pain? {yes/no:20286} NPRS scale: ***/10 Pain location: *** Pain orientation: {Pain Orientation:25161}  PAIN TYPE: {type:313116} Pain description: {PAIN DESCRIPTION:21022940}  Aggravating factors: *** Relieving factors: ***  PRECAUTIONS: {Therapy precautions:24002}  WEIGHT BEARING RESTRICTIONS {Yes ***/No:24003}  FALLS:  Has patient fallen in last 6 months? {yes/no:20286}, Number of falls: ***  LIVING ENVIRONMENT: Lives with: {[facility] lives with:25569::"lives with their family"} Lives in: {Lives in:25570} Stairs: {yes/no:20286}; {Stairs:24000} Has following equipment at home: {Assistive devices:23999}  OCCUPATION: ***  PLOF:  {PLOF:24004}  PATIENT GOALS ***   OBJECTIVE:   DIAGNOSTIC FINDINGS:  ***  PATIENT SURVEYS:  {rehab surveys:24030}  SCREENING FOR RED FLAGS: Bowel or bladder incontinence: {Yes/No:304960894} Spinal tumors: {Yes/No:304960894} Cauda equina syndrome: {Yes/No:304960894} Compression fracture: {Yes/No:304960894} Abdominal aneurysm: {Yes/No:304960894}  COGNITION:  Overall cognitive status: {cognition:24006}     SENSATION:  Light touch: {intact/deficits:24005}  Stereognosis: {intact/deficits:24005}  Hot/Cold: {intact/deficits:24005}  Proprioception: {intact/deficits:24005}  MUSCLE LENGTH: Hamstrings: Right *** deg; Left *** deg Thomas test: Right *** deg; Left *** deg  POSTURE:  ***  PALPATION: ***  LUMBARAROM/PROM  A/PROM A/PROM  05/24/2021  Flexion   Extension   Right lateral flexion   Left lateral flexion   Right rotation   Left rotation    (Blank rows = not tested)  LE AROM/PROM:  A/PROM Right 05/24/2021 Left 05/24/2021  Hip flexion    Hip extension  Hip abduction    Hip adduction    Hip internal rotation    Hip external rotation    Knee flexion    Knee extension    Ankle dorsiflexion    Ankle plantarflexion    Ankle inversion    Ankle eversion     (Blank rows = not tested)  LE MMT:  MMT Right 05/24/2021 Left 05/24/2021  Hip flexion    Hip extension    Hip abduction    Hip adduction    Hip internal rotation    Hip external rotation    Knee flexion    Knee extension    Ankle dorsiflexion    Ankle plantarflexion    Ankle inversion    Ankle eversion     (Blank rows = not tested)  LUMBAR SPECIAL TESTS:  {lumbar special test:25242}  FUNCTIONAL TESTS:  {Functional tests:24029}  GAIT: Distance walked: *** Assistive device utilized: {Assistive devices:23999} Level of assistance: {Levels of assistance:24026} Comments: ***    TODAY'S TREATMENT  ***   PATIENT EDUCATION:  Education details: *** Person educated: {Person  educated:25204} Education method: {Education Method:25205} Education comprehension: {Education Comprehension:25206}   HOME EXERCISE PROGRAM: ***  ASSESSMENT:  CLINICAL IMPRESSION: Patient is a *** y.o. *** who was seen today for physical therapy evaluation and treatment for ***. Objective impairments include {opptimpairments:25111}. These impairments are limiting patient from {activity limitations:25113}. Personal factors including {Personal factors:25162} are also affecting patient's functional outcome. Patient will benefit from skilled PT to address above impairments and improve overall function.  REHAB POTENTIAL: {rehabpotential:25112}  CLINICAL DECISION MAKING: {clinical decision making:25114}  EVALUATION COMPLEXITY: {Evaluation complexity:25115}   GOALS: Goals reviewed with patient? {yes/no:20286}  SHORT TERM GOALS:  STG Name Target Date Goal status  1 *** Baseline:  {follow up:25551} {GOALSTATUS:25110}  2 *** Baseline:  {follow up:25551} {GOALSTATUS:25110}  3 *** Baseline: {follow up:25551} {GOALSTATUS:25110}  4 *** Baseline: {follow up:25551} {GOALSTATUS:25110}  5 *** Baseline: {follow up:25551} {GOALSTATUS:25110}  6 *** Baseline: {follow up:25551} {GOALSTATUS:25110}  7 *** Baseline: {follow up:25551} {GOALSTATUS:25110}   LONG TERM GOALS:   LTG Name Target Date Goal status  1 *** Baseline: {follow up:25551} {GOALSTATUS:25110}  2 *** Baseline: {follow up:25551} {GOALSTATUS:25110}  3 *** Baseline: {follow up:25551} {GOALSTATUS:25110}  4 *** Baseline: {follow up:25551} {GOALSTATUS:25110}  5 *** Baseline: {follow up:25551} {GOALSTATUS:25110}  6 *** Baseline: {follow up:25551} {GOALSTATUS:25110}  7 *** Baseline: {follow up:25551} {GOALSTATUS:25110}   PLAN: PT FREQUENCY: {rehab frequency:25116}  PT DURATION: {rehab duration:25117}  PLANNED INTERVENTIONS: {rehab planned interventions:25118::"Therapeutic exercises","Therapeutic activity","Neuro Muscular  re-education","Balance training","Gait training","Patient/Family education","Joint mobilization"}  PLAN FOR NEXT SESSION: ***   Yordin Rhoda, PT 05/24/2021, 3:22 PM

## 2021-05-25 ENCOUNTER — Ambulatory Visit: Payer: Medicare Other | Admitting: Physical Therapy

## 2021-05-25 ENCOUNTER — Other Ambulatory Visit: Payer: Self-pay | Admitting: Student

## 2021-05-25 DIAGNOSIS — I10 Essential (primary) hypertension: Secondary | ICD-10-CM

## 2021-06-23 ENCOUNTER — Other Ambulatory Visit: Payer: Self-pay | Admitting: Family Medicine

## 2021-07-25 ENCOUNTER — Other Ambulatory Visit: Payer: Self-pay | Admitting: Family Medicine

## 2021-07-25 DIAGNOSIS — I10 Essential (primary) hypertension: Secondary | ICD-10-CM

## 2021-08-23 ENCOUNTER — Other Ambulatory Visit: Payer: Self-pay | Admitting: Family Medicine

## 2021-08-23 DIAGNOSIS — I10 Essential (primary) hypertension: Secondary | ICD-10-CM

## 2021-09-06 ENCOUNTER — Other Ambulatory Visit: Payer: Self-pay | Admitting: Family Medicine

## 2021-09-06 DIAGNOSIS — I10 Essential (primary) hypertension: Secondary | ICD-10-CM

## 2022-03-16 ENCOUNTER — Other Ambulatory Visit: Payer: Self-pay | Admitting: Family Medicine

## 2022-03-16 DIAGNOSIS — I10 Essential (primary) hypertension: Secondary | ICD-10-CM

## 2022-03-16 NOTE — Telephone Encounter (Signed)
Requested medication (s) are due for refill today: routing for review  Requested medication (s) are on the active medication list: yes Last refill:  09/06/21  Future visit scheduled:no  Notes to clinic:  Unable to refill per protocol, Rx not attached to protocol, routing for review     Requested Prescriptions  Pending Prescriptions Disp Refills   amLODipine (NORVASC) 10 MG tablet [Pharmacy Med Name: AMLODIPINE BESYLATE 10 MG TAB] 90 tablet 1    Sig: TAKE 1 TABLET BY MOUTH EVERY DAY     There is no refill protocol information for this order

## 2022-03-29 ENCOUNTER — Ambulatory Visit (INDEPENDENT_AMBULATORY_CARE_PROVIDER_SITE_OTHER): Payer: Medicare Other | Admitting: Student

## 2022-03-29 VITALS — BP 176/112 | HR 79 | Ht 60.0 in | Wt 134.0 lb

## 2022-03-29 DIAGNOSIS — I10 Essential (primary) hypertension: Secondary | ICD-10-CM

## 2022-03-29 DIAGNOSIS — Z1231 Encounter for screening mammogram for malignant neoplasm of breast: Secondary | ICD-10-CM

## 2022-03-29 MED ORDER — AMLODIPINE BESYLATE 10 MG PO TABS: 10.0000 mg | ORAL_TABLET | Freq: Every day | ORAL | 1 refills | Status: DC

## 2022-03-29 NOTE — Progress Notes (Addendum)
  SUBJECTIVE:   CHIEF COMPLAINT / HPI:   High BP and not feeling well, tired/weak  Sick: Monday-Tuesday she was feeling tired and threw up one time, feeling stiff in back. Is feeling better now. Also reports she had a subjective fever. Patient feeling fine today and wanting refill of BP meds.   HTN BP: Has been out of BP med for month, needing refill. Denies any CP/SOB Meds: amlodipine 10  PERTINENT  PMH / PSH: HTN   OBJECTIVE:  BP (!) 176/112   Pulse 79   Ht 5' (1.524 m)   Wt 134 lb (60.8 kg)   SpO2 97%   BMI 26.17 kg/m  Physical Exam Constitutional:      Appearance: Normal appearance.  Cardiovascular:     Rate and Rhythm: Normal rate and regular rhythm.     Pulses: Normal pulses.     Heart sounds: Normal heart sounds. No murmur heard.    No friction rub. No gallop.  Pulmonary:     Effort: Pulmonary effort is normal. No respiratory distress.     Breath sounds: Normal breath sounds. No stridor. No wheezing or rhonchi.  Abdominal:     General: There is no distension.     Palpations: Abdomen is soft. There is no mass.     Tenderness: There is no abdominal tenderness. There is no guarding.     Hernia: No hernia is present.  Skin:    Capillary Refill: Capillary refill takes less than 2 seconds.  Neurological:     Mental Status: She is alert.      ASSESSMENT/PLAN:  Encounter for screening mammogram for malignant neoplasm of breast -     Digital Screening Mammogram, Left and Right; Future  Essential hypertension Assessment & Plan: Patient comes in for BP med refill. BP elevated to 176/112. Reports having been out of medicine for over a month. Denies any CP or SOB or complaints of vision changes, HA, or vomiting. -Refill amlodipine 10 mg -f/u 1 wk for BP check/BMP  Orders: -     amLODIPine Besylate; Take 1 tablet (10 mg total) by mouth daily.  Dispense: 90 tablet; Refill: 1 -     BASIC METABOLIC PANEL WITH GFR; Future  Health Maintenance Patient hasn't been seen  in clinic since 2020. Encouraged patient to schedule check up. Patient due for colonoscopy and mammogram. Patient had colonoscopy with [facility], encouraged to call for next colonoscopy. Mammogram order placed  No follow-ups on file. Holley Bouche, MD 03/30/2022, 6:22 PM PGY-2, [place]

## 2022-03-29 NOTE — Patient Instructions (Addendum)
It was great to see you! Thank you for allowing me to participate in your care!  I recommend that you always bring your medications to each appointment as this makes it easy to ensure we are on the correct medications and helps [place] not miss when refills are needed.  Our plans for today:  - Colonoscopy  Call [facility] to figure out when next Colonoscopy is supposed to be  Phone: [phone] - Mammogram  Call [facility] for mammogram  Phone: [phone]  - High Blood pressure  Medication refilled  Come to clinic in one week for a blood pressure check and a lab to check electrolytes/kidney function  - Check up  Schedule appointment for check up!  She hasn't had a visit to the clinic in a few years   Take care and seek immediate care sooner if you develop any concerns.   Dr. Holley Bouche, MD [place]

## 2022-03-29 NOTE — Assessment & Plan Note (Addendum)
Patient comes in for BP med refill. BP elevated to 176/112. Reports having been out of medicine for over a month. Denies any CP or SOB. -Refill amlodipine 10 mg -f/u 1 wk for BP check/BMP

## 2022-04-05 ENCOUNTER — Other Ambulatory Visit: Payer: Medicare Other

## 2022-04-05 DIAGNOSIS — I10 Essential (primary) hypertension: Secondary | ICD-10-CM | POA: Diagnosis not present

## 2022-04-06 LAB — BASIC METABOLIC PANEL
BUN/Creatinine Ratio: 18 (ref 12–28)
BUN: 14 mg/dL (ref 8–27)
CO2: 22 mmol/L (ref 20–29)
Calcium: 9.9 mg/dL (ref 8.7–10.3)
Chloride: 103 mmol/L (ref 96–106)
Creatinine, Ser: 0.79 mg/dL (ref 0.57–1.00)
Glucose: 80 mg/dL (ref 70–99)
Potassium: 4.1 mmol/L (ref 3.5–5.2)
Sodium: 139 mmol/L (ref 134–144)
eGFR: 78 mL/min/{1.73_m2} (ref >59)

## 2022-04-18 ENCOUNTER — Ambulatory Visit: Payer: Self-pay | Admitting: Family Medicine

## 2022-04-18 ENCOUNTER — Encounter: Payer: Self-pay | Admitting: Student

## 2022-10-08 ENCOUNTER — Other Ambulatory Visit: Payer: Self-pay | Admitting: Student

## 2022-10-08 DIAGNOSIS — I10 Essential (primary) hypertension: Secondary | ICD-10-CM

## 2022-11-21 ENCOUNTER — Encounter: Payer: Self-pay | Admitting: Family Medicine

## 2022-11-21 ENCOUNTER — Ambulatory Visit (INDEPENDENT_AMBULATORY_CARE_PROVIDER_SITE_OTHER): Payer: Medicare Other | Admitting: Family Medicine

## 2022-11-21 ENCOUNTER — Ambulatory Visit: Admission: RE | Admit: 2022-11-21 | Payer: Medicare Other | Source: Ambulatory Visit

## 2022-11-21 VITALS — BP 162/100 | HR 68 | Ht 60.0 in | Wt 134.0 lb

## 2022-11-21 DIAGNOSIS — R053 Chronic cough: Secondary | ICD-10-CM

## 2022-11-21 DIAGNOSIS — G8929 Other chronic pain: Secondary | ICD-10-CM

## 2022-11-21 DIAGNOSIS — M5432 Sciatica, left side: Secondary | ICD-10-CM | POA: Diagnosis present

## 2022-11-21 DIAGNOSIS — I10 Essential (primary) hypertension: Secondary | ICD-10-CM | POA: Diagnosis not present

## 2022-11-21 MED ORDER — AMLODIPINE BESYLATE 10 MG PO TABS
10.0000 mg | ORAL_TABLET | Freq: Every day | ORAL | 2 refills | Status: DC
Start: 2022-11-21 — End: 2023-09-10

## 2022-11-21 MED ORDER — OMEPRAZOLE 20 MG PO CPDR: 20.0000 mg | DELAYED_RELEASE_CAPSULE | Freq: Every day | ORAL | 3 refills | Status: DC

## 2022-11-21 NOTE — Progress Notes (Signed)
    SUBJECTIVE:   CHIEF COMPLAINT / HPI:   HTN Patient ran out of amlodipine about a week ago and has not been taking any BP medication in this time. She has not been able to check her BP at home. No recent headache or vision changes. No N/V.  Back pain Patient continues to have longstanding lower back pain with radiation down the L leg. She is able to ambulate and ride a bike. Sometimes in the mornings, both of her legs will feel numb. She massages them and this resolves. She has not noticed any lower extremity paraesthesias. Over the past 5-6 years, patient has experienced loss of bladder control about 1-2 times per week. No recent changes in this urinary incontinence pattern. No recent falls or head trauma.   Cough For about 15 years now, patient has had a daily, dry, nonproductive cough. No history or asthma or other lung disease. No tobacco use. She feels it worsens when she lays down and she uses two pillows to sleep at night. No LE swelling. She does experience heartburn reflux symptoms 1-2 times a week, especially after eating spicy foods. No notable postnasal drip. No recent fevers, chills, or night sweats.   PERTINENT  PMH / PSH: HTN, chronic back pain w sciatica  OBJECTIVE:   BP (!) 162/100   Pulse 68   Ht 5' (1.524 m)   Wt 134 lb (60.8 kg)   SpO2 98%   BMI 26.17 kg/m   General: Well-appearing, resting comfortably in room CV: 2/6 diastolic murmur. Regular rate. No rubs, gallops, heaves or thrills. Warm and well-perfused. 3+ radial pulses.  Pulm: Breathing comfortably on room air. CTAB. No increased WOB. Abd: Soft, nontender, nondistended. No rebound or guarding. MSK: Tender to palpation of sacral region. + straight leg raise test for L leg.  Ext: No LE edema. Skin: Warm, dry Psych: Pleasant and appropriate  ASSESSMENT/PLAN:   Essential hypertension Blood pressure elevated in clinic today at 162/100 in setting of not being able to take BP medications for the last week.   - Refilled amlodipine 10 mg  - No labs today as they have been stable over the last year  Chronic low back pain with sciatica Patient's chronic back pain continues though she is able to ambulate.  - Placed referral to physical therapy for strength and conditioning for further symptom management  Cough Chronic cough concerning for GERD vs heart failure vs pulmonary pathology. Most likely cough secondary to GERD given frequent reflux symptoms. Less likely heart failure given euvolemic state. Less likely pulmonary pathology due to lack of other constitutional symptoms.  - CXR ordered to rule out pulmonary pathology  - Prilosec ordered for reflux management    Patient to return to clinic in 2 weeks for follow up.  Ivery Quale, MD [facility]

## 2022-11-21 NOTE — Assessment & Plan Note (Signed)
Chronic cough concerning for GERD vs heart failure vs pulmonary pathology. Most likely cough secondary to GERD given frequent reflux symptoms. Less likely heart failure given euvolemic state. Less likely pulmonary pathology due to lack of other constitutional symptoms.  - CXR ordered to rule out pulmonary pathology  - Prilosec ordered for reflux management

## 2022-11-21 NOTE — Assessment & Plan Note (Signed)
Patient's chronic back pain continues though she is able to ambulate.  - Placed referral to physical therapy for strength and conditioning for further symptom management

## 2022-11-21 NOTE — Assessment & Plan Note (Signed)
Blood pressure elevated in clinic today at 162/100 in setting of not being able to take BP medications for the last week.  - Refilled amlodipine 10 mg  - No labs today as they have been stable over the last year

## 2022-11-21 NOTE — Patient Instructions (Addendum)
Thank you so much for coming into clinic today - it was wonderful to meet you!  Today we discussed [name]'s cough, back pain, blood pressure, and reflux symptoms.  - For the cough, we have placed an order for a chest xray for you so that we can check in on your lungs. Please go to [facility] [employer] at your earliest convenience to get this completed. You can walk in for this xray at any time 8am-5pm Monday through Friday. Their phone number is [phone] if you need any help.   - For your back pain, we have placed a referral for you to begin physical therapy. They should contact you to schedule a visit.    - We have refilled your blood pressure medication at your local pharmacy.   - For your reflux, we have sent a prescription for a medication called Prilosec to your pharmacy. Please take this once a day to help with acid reflux symptoms.   Please schedule a clinic appointment here in 2 weeks so that we may check in on how you are doing. Have a wonderful week!  Mackenzie Quale, MD

## 2022-12-07 ENCOUNTER — Other Ambulatory Visit: Payer: Self-pay | Admitting: Family Medicine

## 2022-12-07 DIAGNOSIS — I771 Stricture of artery: Secondary | ICD-10-CM

## 2022-12-07 DIAGNOSIS — I517 Cardiomegaly: Secondary | ICD-10-CM

## 2022-12-07 NOTE — Progress Notes (Signed)
Placed order for CT Angio Aorta to assess torturous thoracic aorta,

## 2022-12-07 NOTE — Progress Notes (Signed)
Placed order for echocardiogram following cardiomegaly noted on CXR.

## 2022-12-12 ENCOUNTER — Encounter: Payer: Self-pay | Admitting: Family Medicine

## 2022-12-12 ENCOUNTER — Other Ambulatory Visit: Payer: Self-pay | Admitting: Family Medicine

## 2022-12-12 DIAGNOSIS — I517 Cardiomegaly: Secondary | ICD-10-CM

## 2022-12-12 NOTE — Progress Notes (Signed)
Placed order for BMP prior to CT angio

## 2022-12-13 ENCOUNTER — Ambulatory Visit (HOSPITAL_COMMUNITY)
Admission: RE | Admit: 2022-12-13 | Discharge: 2022-12-13 | Disposition: A | Discharge status: Home / Self Care | Payer: Medicare Other | Source: Ambulatory Visit | Attending: Family Medicine | Admitting: Family Medicine

## 2022-12-13 ENCOUNTER — Ambulatory Visit (HOSPITAL_COMMUNITY): Payer: Medicare Other

## 2022-12-13 DIAGNOSIS — I517 Cardiomegaly: Secondary | ICD-10-CM | POA: Insufficient documentation

## 2022-12-13 DIAGNOSIS — I771 Stricture of artery: Secondary | ICD-10-CM | POA: Diagnosis not present

## 2022-12-13 DIAGNOSIS — I3139 Other pericardial effusion (noninflammatory): Secondary | ICD-10-CM | POA: Diagnosis not present

## 2022-12-14 LAB — ECHOCARDIOGRAM COMPLETE
AR max vel: 2.09 cm2
AV Area VTI: 2.42 cm2
AV Area mean vel: 2.44 cm2
AV Mean grad: 3 mmHg
AV Peak grad: 6 mmHg
Ao pk vel: 1.22 m/s
Area-P 1/2: 3.61 cm2
S' Lateral: 2.3 cm

## 2022-12-19 ENCOUNTER — Ambulatory Visit (HOSPITAL_COMMUNITY): Admission: RE | Admit: 2022-12-19 | Payer: Medicare Other | Source: Ambulatory Visit

## 2023-01-15 ENCOUNTER — Other Ambulatory Visit: Payer: Self-pay | Admitting: Family Medicine

## 2023-01-15 DIAGNOSIS — I517 Cardiomegaly: Secondary | ICD-10-CM

## 2023-01-15 NOTE — Progress Notes (Signed)
Called patient's daughter to inform of placing cardiology referral for CXR and Echo findings.

## 2023-02-17 ENCOUNTER — Other Ambulatory Visit: Payer: Self-pay | Admitting: Family Medicine

## 2023-03-06 ENCOUNTER — Encounter: Payer: Self-pay | Admitting: Student

## 2023-03-06 ENCOUNTER — Ambulatory Visit (INDEPENDENT_AMBULATORY_CARE_PROVIDER_SITE_OTHER): Payer: 59 | Admitting: Student

## 2023-03-06 VITALS — BP 169/85 | HR 77 | Ht 60.0 in | Wt 135.4 lb

## 2023-03-06 DIAGNOSIS — R350 Frequency of micturition: Secondary | ICD-10-CM | POA: Insufficient documentation

## 2023-03-06 DIAGNOSIS — G8929 Other chronic pain: Secondary | ICD-10-CM | POA: Diagnosis not present

## 2023-03-06 DIAGNOSIS — M5442 Lumbago with sciatica, left side: Secondary | ICD-10-CM | POA: Diagnosis not present

## 2023-03-06 DIAGNOSIS — Z131 Encounter for screening for diabetes mellitus: Secondary | ICD-10-CM | POA: Diagnosis not present

## 2023-03-06 NOTE — Patient Instructions (Addendum)
It was great to see you! Thank you for allowing me to participate in your care!  I recommend that you always bring your medications to each appointment as this makes it easy to ensure we are on the correct medications and helps [place] not miss when refills are needed.  Our plans for today:  - Urinary Incontinence (Peeing on Self) Your need to urinate/pee frequently is coming from your bladder being overactive. We are going to recommend you get physical therapy to help manage/improve this.   - Back Pain Your signs and symptoms are concerning for a back issues that also needs physical therapy to manage. This should improve with therapy.   Pain control  Continue to use Tylenol as needed Can use lidocaine patches as needed (last for 12 hours, then off for 12 hours)     If you haven't heard anything about the Physical Therapy referral within the next 2 weeks, call the clinic and ask about the referral.   Take care and seek immediate care sooner if you develop any concerns.   Dr. Bess Kinds, MD [facility]

## 2023-03-06 NOTE — Assessment & Plan Note (Addendum)
Patient comes in with concerns for urinary frequency that been an issue for over a year.  Patient appreciates pain 4-5 times at night.  Patient also appreciates that sometimes she cannot make it to the bathroom when she has to pee, or will have partial accidents when she coughs/sneezes.  Patient denies any fevers or systemic symptoms, denies any dysuria or urinary hesitancy.  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  Will plan to treat with pelvic floor rehab.  Low concern for UTI/infection given timing and symptoms. - PT referral for pelvic rehab

## 2023-03-06 NOTE — Assessment & Plan Note (Addendum)
Patient comes in for follow-up of her back pain.  Patient reports it has been an issue for the last 2 years.  Patient reports Tylenol helps relieve symptoms well.  Patient reports sometimes when back pain flares, has difficulty walking secondary to pain.  Patient denies any leg weakness, but notes shooting pain runs down left glutes/leg.  Patient exam and history most concerning for degenerative joint disease/bulging disc causing sciatica.  Low concern for neural compromise given lack of weakness.  Will recommend physical therapy to help manage.  Will continue Tylenol for pain management, consider lidocaine patches as needed. - Physical therapy for lumbar back pain/sciatica

## 2023-03-06 NOTE — Progress Notes (Signed)
  SUBJECTIVE:   CHIEF COMPLAINT / HPI:   Back Pain and Urinary Freq   Urinary Symptoms Has been going on for a year, waking up 4-5 times at night to go pee. Sometimes having accidents if she can't make it to the bathroom. Wet's herself sometimes with coughing and sneezing. No fever's or systemic symptoms. Is also appreciates increased water intake. No dysuria, and or hesitancy.    Back Pain Has been an issue for almost 2 years. Tylenol helps with pain when she takes it and makes it go away. Has normal bowel movements / but sometimes having urinary incontinence. Denies any leg weakness. Has shooting pain that runs down leg. Sometimes hard to walk 2/2 to pain. Denies any leg weakness or falls. Denies any systemic symptoms.  PERTINENT  PMH / PSH:    OBJECTIVE:  There were no vitals taken for this visit. Physical Exam Abdominal:     General: There is no distension.     Palpations: Abdomen is soft. There is no mass.     Tenderness: There is no abdominal tenderness.     Hernia: No hernia is present.  Musculoskeletal:     Cervical back: Normal.     Thoracic back: Normal.     Lumbar back: Tenderness present. No swelling, deformity, lacerations, spasms or bony tenderness. Normal range of motion. Positive right straight leg raise test and positive left straight leg raise test.      ASSESSMENT/PLAN:   Assessment & Plan Urinary frequency Patient comes in with concerns for urinary frequency that been an issue for over a year.  Patient appreciates pain 4-5 times at night.  Patient also appreciates that sometimes she cannot make it to the bathroom when she has to pee, or will have partial accidents when she coughs/sneezes.  Patient denies any fevers or systemic symptoms, denies any dysuria or urinary hesitancy.  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  Will plan to treat with pelvic floor rehab.  Low concern for UTI/infection given timing and symptoms. - PT referral  for pelvic rehab Chronic low back pain with left-sided sciatica, unspecified back pain laterality Patient comes in for follow-up of her back pain.  Patient reports it has been an issue for the last 2 years.  Patient reports Tylenol helps relieve symptoms well.  Patient reports sometimes when back pain flares, has difficulty walking secondary to pain.  Patient denies any leg weakness, but notes shooting pain runs down left glutes/leg.  Patient exam and history most concerning for degenerative joint disease/bulging disc causing sciatica.  Low concern for neural compromise given lack of weakness.  Will recommend physical therapy to help manage.  Will continue Tylenol for pain management, consider lidocaine patches as needed. - Physical therapy for lumbar back pain/sciatica No follow-ups on file. Bess Kinds, MD 03/06/2023, 8:13 AM PGY-3, [facility]

## 2023-04-03 ENCOUNTER — Ambulatory Visit: Payer: 59 | Admitting: Internal Medicine

## 2023-05-28 ENCOUNTER — Ambulatory Visit: Payer: 59 | Admitting: Internal Medicine

## 2023-06-08 ENCOUNTER — Ambulatory Visit (HOSPITAL_BASED_OUTPATIENT_CLINIC_OR_DEPARTMENT_OTHER): Payer: 59 | Admitting: Cardiology

## 2023-08-15 ENCOUNTER — Encounter: Payer: Self-pay | Admitting: Cardiology

## 2023-08-15 ENCOUNTER — Ambulatory Visit: Payer: 59 | Attending: Cardiology | Admitting: Cardiology

## 2023-08-15 ENCOUNTER — Other Ambulatory Visit (HOSPITAL_COMMUNITY): Payer: Self-pay

## 2023-08-15 VITALS — BP 152/94 | HR 75 | Resp 16 | Ht 60.0 in | Wt 140.6 lb

## 2023-08-15 DIAGNOSIS — I10 Essential (primary) hypertension: Secondary | ICD-10-CM | POA: Diagnosis not present

## 2023-08-15 DIAGNOSIS — E7800 Pure hypercholesterolemia, unspecified: Secondary | ICD-10-CM

## 2023-08-15 MED ORDER — VALSARTAN-HYDROCHLOROTHIAZIDE 160-12.5 MG PO TABS
1.0000 | ORAL_TABLET | ORAL | 2 refills | Status: AC
Start: 2023-08-15 — End: ?
  Filled 2023-08-15: qty 30, 30d supply, fill #0

## 2023-08-15 NOTE — Patient Instructions (Addendum)
 Medication Instructions:  Your physician has recommended you make the following change in your medication:   1) DECREASE valsartan -hydrochlorothiazide  to 160-12.5 mg daily in the morning  *If you need a refill on your cardiac medications before your next appointment, please call your pharmacy*  Lab Work: In 2-3 weeks at [facility]: BMET, Lipid panel If you have labs (blood work) drawn today and your tests are completely normal, you will receive your results only by: MyChart Message (if you have MyChart) OR A paper copy in the mail If you have any lab test that is abnormal or we need to change your treatment, we will call you to review the results.  Follow-Up: At [facility], you and your health needs are our priority.  As part of our continuing mission to provide you with exceptional heart care, our providers are all part of one team.  This team includes your primary Cardiologist (physician) and Advanced Practice Providers or APPs (Physician Assistants and Nurse Practitioners) who all work together to provide you with the care you need, when you need it.  Your next appointment:   2 month(s)  The format for your next appointment:   In Person  Provider:   Knox Perl, MD{  We recommend signing up for the patient portal called "MyChart".  Sign up information is provided on this After Visit Summary.  MyChart is used to connect with patients for Virtual Visits (Telemedicine).  Patients are able to view lab/test results, encounter notes, upcoming appointments, etc.  Non-urgent messages can be sent to your provider as well.   To learn more about what you can do with MyChart, go to [email or web address].

## 2023-08-15 NOTE — Progress Notes (Signed)
 Cardiology Office Note:  .   Date:  08/15/2023  ID:  Mackenzie Taylor, DOB 07/27/1945, MRN [id number] PCP: Jonne Netters, MD  [facility] Providers Cardiologist:  Knox Perl, MD   History of Present Illness: .   Mackenzie Taylor is a 78 y.o. Asian [place]) female patient referred to me for evaluation of cardiomegaly.  Patient remains asymptomatic.  Past medical history significant for hypertension.  Discussed the use of AI scribe software for clinical note transcription with the patient, who gave verbal consent to proceed.  History of Present Illness Mackenzie Taylor is a 78 year old female with hypertension who presents for evaluation of an enlarged heart. She has been referred for evaluation of an enlarged heart.  Her blood pressure remains consistently high. She consumes a significant amount of coffee, approximately four bags daily, and her diet includes a lot of rice and pork with occasional fried foods. She is physically active, engaging in gardening and using an elliptical machine for about ten minutes daily. She experiences back pain but no other issues during exercise.  She is currently taking amlodipine  for hypertension. There is no mention of any other medications she is taking at home.  Labs   Lab Results  Component Value Date   CHOL 209 (H) 06/02/2016   HDL 50 (L) 06/02/2016   LDLCALC 135 (H) 06/02/2016   TRIG 121 06/02/2016   CHOLHDL 4.2 06/02/2016   Lab Results  Component Value Date   NA 139 04/05/2022   K 4.1 04/05/2022   CO2 22 04/05/2022   GLUCOSE 80 04/05/2022   BUN 14 04/05/2022   CREATININE 0.79 04/05/2022   CALCIUM  9.9 04/05/2022   EGFR 78 04/05/2022   GFRNONAA 79 11/04/2018      Latest Ref Rng & Units 04/05/2022   10:22 AM 11/04/2018   10:25 AM 10/09/2016    9:12 AM  BMP  Glucose 70 - 99 mg/dL 80  956  98   BUN 8 - 27 mg/dL 14  16  20    Creatinine 0.57 - 1.00 mg/dL 2.13  0.86  5.78   BUN/Creat Ratio 12 - 28 18  21  22    Sodium 134 - 144 mmol/L 139  142   139   Potassium 3.5 - 5.2 mmol/L 4.1  3.7  4.2   Chloride 96 - 106 mmol/L 103  104  98   CO2 20 - 29 mmol/L 22  21  20    Calcium  8.7 - 10.3 mg/dL 9.9  46.9  62.9       Latest Ref Rng & Units 09/11/2016   12:03 PM 11/09/2014    1:48 PM 06/20/2010    4:50 PM  CBC  WBC 3.4 - 10.8 x10E3/uL 6.8  5.7    Hemoglobin 11.1 - 15.9 g/dL 52.8  41.3  24.4   Hematocrit 34.0 - 46.6 % 39.4  40.9  43.0   Platelets 150 - 379 x10E3/uL 290  266     No results found for: "HGBA1C"  Lab Results  Component Value Date   TSH 2.38 06/02/2016    ROS  Review of Systems  Cardiovascular:  Negative for chest pain, dyspnea on exertion and leg swelling.    Physical Exam:   VS:  BP (!) 152/94 (BP Location: Left Arm, Patient Position: Sitting, Cuff Size: Normal)   Pulse 75   Resp 16   Ht 5' (1.524 m)   Wt 140 lb 9.6 oz (63.8 kg)   SpO2 97%   BMI 27.46  kg/m    Wt Readings from Last 3 Encounters:  08/15/23 140 lb 9.6 oz (63.8 kg)  03/06/23 135 lb 6.4 oz (61.4 kg)  11/21/22 134 lb (60.8 kg)    Physical Exam Neck:     Vascular: No carotid bruit or JVD.  Cardiovascular:     Rate and Rhythm: Normal rate and regular rhythm.     Pulses: Intact distal pulses.     Heart sounds: Normal heart sounds. No murmur heard.    No gallop.  Pulmonary:     Effort: Pulmonary effort is normal.     Breath sounds: Normal breath sounds.  Abdominal:     General: Bowel sounds are normal.     Palpations: Abdomen is soft.  Musculoskeletal:     Right lower leg: No edema.     Left lower leg: No edema.     ECHOCARDIOGRAM COMPLETE 12/13/2022 1. Left ventricular ejection fraction, by estimation, is 65 to 70%. The left ventricle has normal function. The left ventricle has no regional wall motion abnormalities. There is mild left ventricular hypertrophy. Left ventricular diastolic parameters are consistent with Grade I diastolic dysfunction (impaired relaxation). There is a septal shudder. 2. 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 3. Right ventricular systolic function is normal. The right ventricular size is normal. Tricuspid regurgitation signal is inadequate for assessing PA pressure. 4. The mitral valve is normal in structure. Trivial mitral valve regurgitation. No evidence of mitral stenosis. 5. The aortic valve is tricuspid. Aortic valve regurgitation is not visualized. No aortic stenosis is present. 6. A small pericardial effusion is present. The pericardial effusion is anterior to the right ventricle. 7. The inferior vena cava is normal in size with greater than 50% respiratory variability, suggesting right atrial pressure of 3 mmHg. EKG:         Medications and allergies    No Known Allergies   Current Outpatient Medications:    amLODipine  (NORVASC ) 10 MG tablet, Take 1 tablet (10 mg total) by mouth daily., Disp: 90 tablet, Rfl: 2   omeprazole  (PRILOSEC) 20 MG capsule, TAKE 1 CAPSULE BY MOUTH EVERY DAY, Disp: 90 capsule, Rfl: 1   valsartan -hydrochlorothiazide  (DIOVAN  HCT) 160-12.5 MG tablet, Take 1 tablet by mouth every morning., Disp: 30 tablet, Rfl: 2   cetirizine  (ZYRTEC ) 10 MG tablet, Take 1 tablet (10 mg total) by mouth daily. (Patient not taking: Reported on 08/15/2023), Disp: 30 tablet, Rfl: 11   Meds ordered this encounter  Medications   valsartan -hydrochlorothiazide  (DIOVAN  HCT) 160-12.5 MG tablet    Sig: Take 1 tablet by mouth every morning.    Dispense:  30 tablet    Refill:  2     There are no discontinued medications.   ASSESSMENT AND PLAN: .      ICD-10-CM   1. Primary hypertension  I10 EKG 12-Lead    valsartan -hydrochlorothiazide  (DIOVAN  HCT) 160-12.5 MG tablet    Basic metabolic panel with GFR    Basic metabolic panel with GFR    2. Pure hypercholesterolemia  E78.00 Lipid panel    Lipid panel     Assessment & Plan Hypertension   Chronic hypertension with consistently elevated blood pressure, likely exacerbated by dietary habits including high intake  of coffee, rice, and fried foods. No cardiomegaly observed. Current medication regimen includes amlodipine . Add valsartan  HCT to enhance blood pressure control and reduce cardiovascular risk. Plan blood work to monitor renal function and lipid profile after initiating new medication.  Continue amlodipine . Start  valsartan  HCT 160/12.5 mg in the morning. Advise dietary modifications: reduce salt, pork, and fried food intake. Perform blood work in 2-3 weeks to assess renal function and lipid profile.  With regard to cardiomegaly, I reviewed her echocardiogram, she has just a normal variant of papillary muscle attachment and mild LVH related to hypertension but otherwise no significant valvular abnormality.  No further evaluation is indicated.  She does not have any clinical evidence of heart failure and she denies dyspnea or chest pain.  She still is exercising regularly for at least 10 to 15 minutes on an elliptical at home without symptoms.  Healthy eating habits were discussed and to increase physical activity were also discussed with the patient and her daughter who is translating for me.  I will see her back in 2 months and if she remains stable, I will see her back on a as needed basis.  Signed,  Knox Perl, MD, [facility] 08/15/2023, 5:04 PM [facility] [address] [place], [place] [address] Phone: [phone]. Fax:  [phone]

## 2023-08-26 ENCOUNTER — Other Ambulatory Visit: Payer: Self-pay | Admitting: Family Medicine

## 2023-09-07 ENCOUNTER — Other Ambulatory Visit: Payer: Self-pay | Admitting: Family Medicine

## 2023-09-07 DIAGNOSIS — I10 Essential (primary) hypertension: Secondary | ICD-10-CM

## 2023-10-17 ENCOUNTER — Ambulatory Visit: Admitting: Cardiology

## 2023-12-05 ENCOUNTER — Ambulatory Visit: Admitting: Cardiology

## 2024-01-28 ENCOUNTER — Ambulatory Visit: Attending: Cardiology | Admitting: Cardiology

## 2024-01-28 NOTE — Progress Notes (Deleted)
  Cardiology Office Note:  .   Date:  01/28/2024  ID:  Mackenzie Taylor, DOB 1945-08-27, MRN [id number] PCP: Theophilus Pagan, MD  [facility] Providers Cardiologist:  Gordy Bergamo, MD { Click to update primary MD,subspecialty MD or APP then REFRESH:1}  History of Present Illness: .   Mackenzie Taylor is a 78 y.o. Asian [place]) female patient presents for follow  up of hypertension.  Patient remains asymptomatic.  I had started her on valsartan  HCT 160/12.5 mg daily and recommended lipid profile testing on her last office visit 08/15/2023.  Cardiac Studies relevent.    ECHOCARDIOGRAM COMPLETE 12/13/2022 1. Left ventricular ejection fraction, by estimation, is 65 to 70%. The left ventricle has normal function. The left ventricle has no regional wall motion abnormalities. There is mild left ventricular hypertrophy. Left ventricular diastolic parameters are consistent with Grade I diastolic dysfunction (impaired relaxation). There is a septal shudder. 2. 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    Discussed the use of AI scribe software for clinical note transcription with the patient, who gave verbal consent to proceed.  History of Present Illness    Labs    ROS  ***ROS Physical Exam:   VS:  There were no vitals taken for this visit.   Wt Readings from Last 3 Encounters:  08/15/23 140 lb 9.6 oz (63.8 kg)  03/06/23 135 lb 6.4 oz (61.4 kg)  11/21/22 134 lb (60.8 kg)    BP Readings from Last 3 Encounters:  08/15/23 (!) 152/94  03/06/23 (!) 169/85  11/21/22 (!) 162/100   ***Physical Exam EKG:         ASSESSMENT AND PLAN: .    No diagnosis found.  Assessment and Plan Assessment & Plan    Follow up: ***  Signed,  Gordy Bergamo, MD, [facility] 01/28/2024, 6:51 AM [facility] [address] [place], [place] [address] Phone: [phone]. Fax:  [phone]

## 2024-03-07 ENCOUNTER — Other Ambulatory Visit: Payer: Self-pay | Admitting: Family Medicine

## 2024-03-24 ENCOUNTER — Ambulatory Visit (HOSPITAL_COMMUNITY)

## 2024-03-24 ENCOUNTER — Telehealth: Payer: Self-pay

## 2024-03-24 ENCOUNTER — Encounter: Payer: Self-pay | Admitting: Student

## 2024-03-24 ENCOUNTER — Ambulatory Visit: Admitting: Student

## 2024-03-24 VITALS — BP 140/100 | HR 81 | Ht 60.0 in | Wt 136.8 lb

## 2024-03-24 DIAGNOSIS — K219 Gastro-esophageal reflux disease without esophagitis: Secondary | ICD-10-CM

## 2024-03-24 DIAGNOSIS — I10 Essential (primary) hypertension: Secondary | ICD-10-CM

## 2024-03-24 DIAGNOSIS — Z13220 Encounter for screening for lipoid disorders: Secondary | ICD-10-CM

## 2024-03-24 DIAGNOSIS — M5441 Lumbago with sciatica, right side: Secondary | ICD-10-CM

## 2024-03-24 DIAGNOSIS — M8588 Other specified disorders of bone density and structure, other site: Secondary | ICD-10-CM

## 2024-03-24 DIAGNOSIS — Z131 Encounter for screening for diabetes mellitus: Secondary | ICD-10-CM

## 2024-03-24 MED ORDER — FAMOTIDINE 20 MG PO TABS: 20.0000 mg | ORAL_TABLET | Freq: Two times a day (BID) | ORAL | 2 refills | Status: AC

## 2024-03-24 MED ORDER — PREDNISONE 50 MG PO TABS: 50.0000 mg | ORAL_TABLET | Freq: Every day | ORAL | 0 refills | Status: AC

## 2024-03-24 NOTE — Patient Instructions (Addendum)
 It was great to see you today!   I am ordering an MRI of your lower back to look for fractures.  I am prescribing a steroid you should take once per day in the morning with breakfast for 5 days to help with pain and inflammation.  Stop taking the medication called omeprazole  as this can worsen your bone health.   I am ordering a scan in addition to the MRI to look at your bone health. Once I have all the results please follow back up.   I have ordered lab work today. I will send you a message through MyChart or send you a letter with your results. If there is an abnormal result, I will give you a call.    No future appointments.  Please arrive 15 minutes before your appointment to ensure smooth check in process.  If you are more than 15 minutes late, you may be asked to reschedule.   Please bring a list of your medications with you to all appointments.   Please call the clinic at [phone] if your symptoms worsen or you have any concerns.  Thank you for allowing me to participate in your care, Dr. Damien Pinal [facility]

## 2024-03-24 NOTE — Assessment & Plan Note (Signed)
 History concerning for lumbar compression fracture.  STAT MRI lumbar spine ordered with shooting pain down her right leg  Order repeat DEXA scan  Prescribe 50 mg prednisone  for 5 days to help with pain and inflammation  Hold on NSAIDs until repeat BMP  Consider calcitonin if MRI shows acute fracture

## 2024-03-24 NOTE — Progress Notes (Signed)
° ° °  SUBJECTIVE:   CHIEF COMPLAINT / HPI:   Mackenzie Taylor is a 78 y.o. female presenting for low back pain.  Patient's daughter was in attendance and assisted with HPI. In person interpreter present for interview.   Low back pain - Chronic low back pain present for two years, localized to the same area - Pain intermittently radiates down the right leg - Acute worsening of pain recently, at times so severe she must crawl in the house - Uses Tylenol  - No recent falls reported - History of Lumbar Osteopenia, last DEXA 2018 - currently on PPI for GERD   Hypertension - Takes antihypertensive medication, dose taken this morning - Feels anxious in medical settings, which she believes elevates her blood pressure readings  PERTINENT  PMH / PSH: reviewed and updated.  OBJECTIVE:   BP (!) 140/100   Pulse 81   Ht 5' (1.524 m)   Wt 136 lb 12.8 oz (62.1 kg)   SpO2 100%   BMI 26.72 kg/m   Well-appearing, no acute distress Cardio: Regular rate, regular rhythm, no murmurs on exam. Pulm: Clear, no wheezing, no crackles. No increased work of breathing Abdominal: bowel sounds present, soft, non-tender, non-distended Extremities: no peripheral edema MSK: point tenderness over T12/L1 spinous process that reproduces pain  Neuro: alert and oriented x3, strength intact and equal bilaterally in UE and LE, pupils equal and reactive to light.   ASSESSMENT/PLAN:   Assessment & Plan Osteopenia of lumbar spine Acute bilateral low back pain with right-sided sciatica History concerning for lumbar compression fracture.  STAT MRI lumbar spine ordered with shooting pain down her right leg  Order repeat DEXA scan  Prescribe 50 mg prednisone  for 5 days to help with pain and inflammation  Hold on NSAIDs until repeat BMP  Consider calcitonin if MRI shows acute fracture  Essential hypertension BP above goal today  Check BMP  Follow up with PCP  Gastroesophageal reflux disease without esophagitis Stop  PPI Prescribed Pepcid   Screening for hyperlipidemia Check lipid panel  Screening for diabetes mellitus (DM) Check A1c today    Patient left before blood work could be collected. Please check labs at next visit.   Damien Pinal, DO [facility]

## 2024-03-24 NOTE — Assessment & Plan Note (Signed)
 Stop PPI Prescribed Pepcid 

## 2024-03-24 NOTE — Assessment & Plan Note (Signed)
 BP above goal today  Check BMP  Follow up with PCP

## 2024-03-24 NOTE — Telephone Encounter (Signed)
 Called centralized scheduling and spoke with Curtistine to schedule an MRI appointment.  Was able to schedule for 03/24/24 at 1pm with arrival time being at 1245pm at [facility].  Curtistine called patient to make patient aware.  Harlene Reiter, CMA

## 2024-04-07 ENCOUNTER — Ambulatory Visit (HOSPITAL_COMMUNITY)
Admission: RE | Admit: 2024-04-07 | Discharge: 2024-04-07 | Disposition: A | Discharge status: Home / Self Care | Source: Ambulatory Visit | Attending: Family Medicine | Admitting: Family Medicine

## 2024-04-07 ENCOUNTER — Telehealth: Payer: Self-pay

## 2024-04-07 DIAGNOSIS — M5441 Lumbago with sciatica, right side: Secondary | ICD-10-CM | POA: Insufficient documentation

## 2024-04-07 DIAGNOSIS — G8929 Other chronic pain: Secondary | ICD-10-CM

## 2024-04-07 NOTE — Telephone Encounter (Signed)
 Received call from [facility] regarding results from recent MRI spine.   IMPRESSION: 1. 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 2. 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 3. Partially evaluated 3.7 cm infrarenal abdominal aortic aneurysm, recommend further evaluation. This finding will be communicated to the ordering provider via a virtual radiology assistant.  Forwarding to ordering provider, Dr. Cleotilde.   Chiquita JAYSON English, RN

## 2024-04-08 MED ORDER — GABAPENTIN 100 MG PO CAPS: 100.0000 mg | ORAL_CAPSULE | Freq: Three times a day (TID) | ORAL | 0 refills | Status: DC

## 2024-04-08 NOTE — Telephone Encounter (Signed)
 Called daughter of patient to discuss results.   Due to her mom's neuropathic pain will send referral to spine specialist for evaluation.   Daughter also requesting something for pain. Steroids did seem to improve her mom's pain. Due to the risk of worsening osteoporosis with steroids, will trial gabapentin 100 mg. Instructed daughter to try one pill for now and if her mom does not have side effects she can increase to TID. Reviewed recent lab work on 12/27 which showed normal kidney function.   Damien Pinal, DO [facility], PGY-3 04/08/2024 3:34 PM

## 2024-05-09 ENCOUNTER — Other Ambulatory Visit: Payer: Self-pay | Admitting: Student

## 2024-05-09 DIAGNOSIS — G8929 Other chronic pain: Secondary | ICD-10-CM
